# Patient Record
Sex: Female | Born: 1980 | Race: White | Hispanic: No | Marital: Married | Smoking: Never smoker
Health system: Southern US, Community
[De-identification: ages and names within clinical notes are randomized; demographics above are authoritative.]

## PROBLEM LIST (undated history)

## (undated) DIAGNOSIS — F32A Depression, unspecified: Secondary | ICD-10-CM

## (undated) DIAGNOSIS — F419 Anxiety disorder, unspecified: Secondary | ICD-10-CM

## (undated) DIAGNOSIS — F319 Bipolar disorder, unspecified: Secondary | ICD-10-CM

## (undated) DIAGNOSIS — R7303 Prediabetes: Secondary | ICD-10-CM

## (undated) DIAGNOSIS — F329 Major depressive disorder, single episode, unspecified: Secondary | ICD-10-CM

## (undated) DIAGNOSIS — D649 Anemia, unspecified: Secondary | ICD-10-CM

## (undated) DIAGNOSIS — G43909 Migraine, unspecified, not intractable, without status migrainosus: Secondary | ICD-10-CM

## (undated) HISTORY — PX: WISDOM TOOTH EXTRACTION: SHX21

## (undated) HISTORY — PX: GASTRIC BYPASS: SHX52

## (undated) HISTORY — PX: CHOLECYSTECTOMY: SHX55

## (undated) HISTORY — PX: OCCIPITAL NEURECTOMY: SHX2096

## (undated) MED FILL — Iron Sucrose Inj 20 MG/ML (Fe Equiv): INTRAVENOUS | Qty: 15 | Status: AC

---

## 2007-03-24 ENCOUNTER — Ambulatory Visit: Payer: Self-pay | Admitting: Internal Medicine

## 2007-11-29 ENCOUNTER — Ambulatory Visit: Payer: Self-pay | Admitting: Obstetrics and Gynecology

## 2008-01-26 ENCOUNTER — Ambulatory Visit: Payer: Self-pay | Admitting: Obstetrics and Gynecology

## 2008-04-26 ENCOUNTER — Inpatient Hospital Stay: Payer: Self-pay | Admitting: Obstetrics and Gynecology

## 2009-01-30 ENCOUNTER — Ambulatory Visit: Payer: Self-pay | Admitting: Family Medicine

## 2009-05-08 ENCOUNTER — Ambulatory Visit: Payer: Self-pay

## 2010-04-21 ENCOUNTER — Ambulatory Visit: Payer: Self-pay | Admitting: Internal Medicine

## 2010-08-26 ENCOUNTER — Ambulatory Visit: Payer: Self-pay | Admitting: Internal Medicine

## 2011-06-08 ENCOUNTER — Ambulatory Visit: Payer: Self-pay | Admitting: Family Medicine

## 2011-06-27 ENCOUNTER — Ambulatory Visit: Payer: Self-pay | Admitting: Internal Medicine

## 2011-09-14 DIAGNOSIS — M792 Neuralgia and neuritis, unspecified: Secondary | ICD-10-CM | POA: Insufficient documentation

## 2011-09-14 DIAGNOSIS — G43009 Migraine without aura, not intractable, without status migrainosus: Secondary | ICD-10-CM | POA: Insufficient documentation

## 2011-09-29 DIAGNOSIS — F32A Depression, unspecified: Secondary | ICD-10-CM | POA: Insufficient documentation

## 2011-10-12 ENCOUNTER — Ambulatory Visit: Payer: Self-pay

## 2011-10-15 DIAGNOSIS — M5481 Occipital neuralgia: Secondary | ICD-10-CM | POA: Insufficient documentation

## 2011-12-29 ENCOUNTER — Ambulatory Visit: Payer: Self-pay | Admitting: Family Medicine

## 2011-12-29 LAB — URINALYSIS, COMPLETE
Bilirubin,UR: NEGATIVE
Glucose,UR: NEGATIVE mg/dL (ref 0–75)
Ketone: NEGATIVE
Ph: 7 (ref 4.5–8.0)
Protein: NEGATIVE
Specific Gravity: 1.01 (ref 1.003–1.030)

## 2012-01-01 LAB — URINE CULTURE

## 2012-03-03 DIAGNOSIS — R519 Headache, unspecified: Secondary | ICD-10-CM | POA: Insufficient documentation

## 2012-04-18 ENCOUNTER — Ambulatory Visit: Payer: Self-pay

## 2012-04-18 LAB — URINALYSIS, COMPLETE
Bilirubin,UR: NEGATIVE
Ketone: NEGATIVE
Ph: 6 (ref 4.5–8.0)
RBC,UR: >30 /HPF (ref 0–5)
Specific Gravity: >=1.03 (ref 1.003–1.030)

## 2012-04-20 LAB — URINE CULTURE

## 2012-05-01 ENCOUNTER — Emergency Department: Payer: Self-pay | Admitting: Emergency Medicine

## 2012-05-01 LAB — COMPREHENSIVE METABOLIC PANEL
Albumin: 3.8 g/dL (ref 3.4–5.0)
Alkaline Phosphatase: 100 U/L (ref 50–136)
Calcium, Total: 9.4 mg/dL (ref 8.5–10.1)
Co2: 20 mmol/L — ABNORMAL LOW (ref 21–32)
EGFR (Non-African Amer.): >60
Glucose: 133 mg/dL — ABNORMAL HIGH (ref 65–99)
Osmolality: 277 (ref 275–301)
SGOT(AST): 18 U/L (ref 15–37)
SGPT (ALT): 20 U/L (ref 12–78)
Sodium: 138 mmol/L (ref 136–145)
Total Protein: 7.7 g/dL (ref 6.4–8.2)

## 2012-05-01 LAB — CBC
HCT: 38.7 % (ref 35.0–47.0)
HGB: 12.6 g/dL (ref 12.0–16.0)
RBC: 4.62 10*6/uL (ref 3.80–5.20)
WBC: 6.8 10*3/uL (ref 3.6–11.0)

## 2012-05-03 ENCOUNTER — Ambulatory Visit: Payer: Self-pay

## 2012-05-03 LAB — LITHIUM LEVEL: Lithium: 0.58 mmol/L — ABNORMAL LOW

## 2012-05-10 ENCOUNTER — Ambulatory Visit: Payer: Self-pay

## 2012-05-10 LAB — LITHIUM LEVEL: Lithium: 0.91 mmol/L

## 2012-05-13 ENCOUNTER — Ambulatory Visit: Payer: Self-pay | Admitting: Internal Medicine

## 2012-05-13 LAB — URINALYSIS, COMPLETE
Blood: NEGATIVE
Nitrite: NEGATIVE
Ph: 5 (ref 4.5–8.0)
Protein: NEGATIVE
Specific Gravity: 1.025 (ref 1.003–1.030)

## 2012-05-13 LAB — PREGNANCY, URINE: Pregnancy Test, Urine: NEGATIVE m[IU]/mL

## 2012-05-14 LAB — URINE CULTURE

## 2012-08-22 DIAGNOSIS — G43909 Migraine, unspecified, not intractable, without status migrainosus: Secondary | ICD-10-CM | POA: Insufficient documentation

## 2012-11-17 ENCOUNTER — Ambulatory Visit: Payer: Self-pay

## 2012-11-17 LAB — LITHIUM LEVEL: Lithium: 0.93 mmol/L

## 2012-11-23 ENCOUNTER — Emergency Department: Payer: Self-pay | Admitting: Emergency Medicine

## 2012-11-23 LAB — ACETAMINOPHEN LEVEL: Acetaminophen: 3 ug/mL — ABNORMAL LOW

## 2012-11-23 LAB — COMPREHENSIVE METABOLIC PANEL
Albumin: 3.3 g/dL — ABNORMAL LOW (ref 3.4–5.0)
Alkaline Phosphatase: 127 U/L (ref 50–136)
Anion Gap: 5 — ABNORMAL LOW (ref 7–16)
BUN: 9 mg/dL (ref 7–18)
Co2: 27 mmol/L (ref 21–32)
Creatinine: 0.63 mg/dL (ref 0.60–1.30)
EGFR (African American): >60
Glucose: 88 mg/dL (ref 65–99)
Osmolality: 285 (ref 275–301)
Potassium: 3.6 mmol/L (ref 3.5–5.1)
SGOT(AST): 93 U/L — ABNORMAL HIGH (ref 15–37)
SGPT (ALT): 133 U/L — ABNORMAL HIGH (ref 12–78)
Sodium: 144 mmol/L (ref 136–145)

## 2012-11-23 LAB — CBC
HGB: 11 g/dL — ABNORMAL LOW (ref 12.0–16.0)
MCH: 25.7 pg — ABNORMAL LOW (ref 26.0–34.0)
MCHC: 32.6 g/dL (ref 32.0–36.0)
MCV: 79 fL — ABNORMAL LOW (ref 80–100)
Platelet: 378 10*3/uL (ref 150–440)
RBC: 4.27 10*6/uL (ref 3.80–5.20)
RDW: 17.2 % — ABNORMAL HIGH (ref 11.5–14.5)
WBC: 8.1 10*3/uL (ref 3.6–11.0)

## 2012-11-23 LAB — DRUG SCREEN, URINE
Amphetamines, Ur Screen: NEGATIVE (ref ?–1000)
Barbiturates, Ur Screen: NEGATIVE (ref ?–200)
Benzodiazepine, Ur Scrn: NEGATIVE (ref ?–200)
Cannabinoid 50 Ng, Ur ~~LOC~~: NEGATIVE (ref ?–50)
MDMA (Ecstasy)Ur Screen: NEGATIVE (ref ?–500)
Methadone, Ur Screen: NEGATIVE (ref ?–300)
Opiate, Ur Screen: POSITIVE (ref ?–300)
Phencyclidine (PCP) Ur S: NEGATIVE (ref ?–25)
Tricyclic, Ur Screen: NEGATIVE (ref ?–1000)

## 2012-11-23 LAB — ETHANOL: Ethanol: <3 mg/dL

## 2012-11-23 LAB — TSH: Thyroid Stimulating Horm: 3.06 u[IU]/mL

## 2013-01-23 ENCOUNTER — Ambulatory Visit: Payer: Self-pay

## 2013-01-23 LAB — BASIC METABOLIC PANEL
BUN: 8 mg/dL (ref 7–18)
Calcium, Total: 8.9 mg/dL (ref 8.5–10.1)
EGFR (African American): >60
EGFR (Non-African Amer.): >60
Glucose: 91 mg/dL (ref 65–99)
Sodium: 140 mmol/L (ref 136–145)

## 2013-01-23 LAB — LITHIUM LEVEL: Lithium: 0.82 mmol/L

## 2013-03-28 ENCOUNTER — Encounter (HOSPITAL_COMMUNITY): Payer: Self-pay | Admitting: Emergency Medicine

## 2013-03-28 ENCOUNTER — Emergency Department (HOSPITAL_COMMUNITY)
Admission: EM | Admit: 2013-03-28 | Discharge: 2013-03-29 | Disposition: A | Discharge status: Other Acute Inpatient Hospital | Payer: 59 | Attending: Emergency Medicine | Admitting: Emergency Medicine

## 2013-03-28 DIAGNOSIS — F3289 Other specified depressive episodes: Secondary | ICD-10-CM | POA: Insufficient documentation

## 2013-03-28 DIAGNOSIS — Z79899 Other long term (current) drug therapy: Secondary | ICD-10-CM | POA: Insufficient documentation

## 2013-03-28 DIAGNOSIS — G43909 Migraine, unspecified, not intractable, without status migrainosus: Secondary | ICD-10-CM | POA: Insufficient documentation

## 2013-03-28 DIAGNOSIS — F419 Anxiety disorder, unspecified: Secondary | ICD-10-CM

## 2013-03-28 DIAGNOSIS — Z3202 Encounter for pregnancy test, result negative: Secondary | ICD-10-CM | POA: Insufficient documentation

## 2013-03-28 DIAGNOSIS — F411 Generalized anxiety disorder: Secondary | ICD-10-CM | POA: Insufficient documentation

## 2013-03-28 DIAGNOSIS — F329 Major depressive disorder, single episode, unspecified: Secondary | ICD-10-CM

## 2013-03-28 HISTORY — DX: Major depressive disorder, single episode, unspecified: F32.9

## 2013-03-28 HISTORY — DX: Depression, unspecified: F32.A

## 2013-03-28 HISTORY — DX: Anxiety disorder, unspecified: F41.9

## 2013-03-28 HISTORY — DX: Migraine, unspecified, not intractable, without status migrainosus: G43.909

## 2013-03-28 LAB — COMPREHENSIVE METABOLIC PANEL
ALT: 22 U/L (ref 0–35)
BUN: 9 mg/dL (ref 6–23)
CO2: 15 mEq/L — ABNORMAL LOW (ref 19–32)
Calcium: 9.7 mg/dL (ref 8.4–10.5)
Chloride: 99 mEq/L (ref 96–112)
Creatinine, Ser: 0.69 mg/dL (ref 0.50–1.10)
GFR calc Af Amer: >90 mL/min (ref >90)
GFR calc non Af Amer: >90 mL/min (ref >90)
Glucose, Bld: 107 mg/dL — ABNORMAL HIGH (ref 70–99)
Total Bilirubin: 0.3 mg/dL (ref 0.3–1.2)
Total Protein: 7.5 g/dL (ref 6.0–8.3)

## 2013-03-28 LAB — CBC
HCT: 41 % (ref 36.0–46.0)
MCH: 26.1 pg (ref 26.0–34.0)
MCHC: 32.4 g/dL (ref 30.0–36.0)
MCV: 80.4 fL (ref 78.0–100.0)
RBC: 5.1 MIL/uL (ref 3.87–5.11)
RDW: 16.4 % — ABNORMAL HIGH (ref 11.5–15.5)
WBC: 14.2 10*3/uL — ABNORMAL HIGH (ref 4.0–10.5)

## 2013-03-28 LAB — RAPID URINE DRUG SCREEN, HOSP PERFORMED
Amphetamines: NOT DETECTED
Opiates: NOT DETECTED

## 2013-03-28 LAB — POCT PREGNANCY, URINE: Preg Test, Ur: NEGATIVE

## 2013-03-28 LAB — LITHIUM LEVEL: Lithium Lvl: 0.25 mEq/L — ABNORMAL LOW (ref 0.80–1.40)

## 2013-03-28 LAB — ETHANOL: Alcohol, Ethyl (B): <11 mg/dL (ref 0–11)

## 2013-03-28 LAB — SALICYLATE LEVEL: Salicylate Lvl: 2.7 mg/dL — ABNORMAL LOW (ref 2.8–20.0)

## 2013-03-28 MED ORDER — ONDANSETRON HCL 4 MG PO TABS: 4.0000 mg | ORAL_TABLET | Freq: Three times a day (TID) | ORAL | Status: DC | PRN

## 2013-03-28 MED ORDER — IBUPROFEN 600 MG PO TABS
600.0000 mg | ORAL_TABLET | Freq: Three times a day (TID) | ORAL | Status: DC | PRN
  Administered 2013-03-28 – 2013-03-29 (×2): 600 mg via ORAL
  Filled 2013-03-28 (×2): qty 3

## 2013-03-28 MED ORDER — LORAZEPAM 1 MG PO TABS
1.0000 mg | ORAL_TABLET | Freq: Three times a day (TID) | ORAL | Status: DC | PRN
  Administered 2013-03-28 – 2013-03-29 (×2): 1 mg via ORAL
  Filled 2013-03-28 (×2): qty 1

## 2013-03-28 MED ORDER — ACETAMINOPHEN 325 MG PO TABS
650.0000 mg | ORAL_TABLET | ORAL | Status: DC | PRN
  Administered 2013-03-29 (×2): 650 mg via ORAL
  Filled 2013-03-28 (×2): qty 2

## 2013-03-28 MED ORDER — ALUM & MAG HYDROXIDE-SIMETH 200-200-20 MG/5ML PO SUSP
30.0000 mL | ORAL | Status: DC | PRN
  Filled 2013-03-28: qty 30

## 2013-03-28 MED ORDER — ZOLPIDEM TARTRATE 5 MG PO TABS
5.0000 mg | ORAL_TABLET | Freq: Every evening | ORAL | Status: DC | PRN
  Administered 2013-03-28: 5 mg via ORAL
  Filled 2013-03-28: qty 1

## 2013-03-28 NOTE — ED Notes (Signed)
Patient arrived to unit; no s/s of distress noted. Pt with dull, flat affect endorsing depression. Pt states she is not currently having thoughts of SI and verbally contracts for safety. Pt states she has been on Vicodin for a year and a half and is trying wean herself off. 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

## 2013-03-28 NOTE — ED Provider Notes (Signed)
CSN: [id number]     Arrival date & time 03/28/13  1813 History   First MD Initiated Contact with Patient 03/28/13 1828     Chief Complaint  Patient presents with  . Medical Clearance   (Consider location/radiation/quality/duration/timing/severity/associated sxs/prior Treatment) HPI Patient presents to the emergency department with complaints of increased depression and suicidal ideation.  The patient denies hallucinations, chest pain, shortness of breath, nausea, vomiting, weakness, numbness, or dizziness.  Patient, states she's not been on her medications except the last 4 days.  Patient was sent here by her psychiatrist Past Medical History  Diagnosis Date  . Depression   . Migraine   . Anxiety    Past Surgical History  Procedure Laterality Date  . Gastric bypass    . Cholecystectomy    . Cesarean section     No family history on file. History  Substance Use Topics  . Smoking status: Never Smoker   . Smokeless tobacco: Not on file  . Alcohol Use: No   OB History   Grav Para Term Preterm Abortions TAB SAB Ect Mult Living                 Review of Systems All other systems negative except as documented in the HPI. All pertinent positives and negatives as reviewed in the HPI. Allergies  Review of patient's allergies indicates no known allergies.  Home Medications   Current Outpatient Rx  Name  Route  Sig  Dispense  Refill  . eletriptan (RELPAX) 20 MG tablet   Oral   Take 20 mg by mouth as needed for migraine. One tablet by mouth at onset of headache. May repeat in 2 hours if headache persists or recurs.         Marland Kitchen HYDROcodone-acetaminophen (NORCO) 10-325 MG per tablet   Oral   Take 1 tablet by mouth every 6 (six) hours as needed for pain.         Marland Kitchen ibuprofen (ADVIL,MOTRIN) 200 MG tablet   Oral   Take 800 mg by mouth every 8 (eight) hours as needed for pain.         Marland Kitchen lithium carbonate 300 MG capsule   Oral   Take 900 mg by mouth at bedtime.         .  pregabalin (LYRICA) 150 MG capsule   Oral   Take 150-300 mg by mouth 2 (two) times daily. 1 cap in am, 2 caps in pm         . topiramate (TOPAMAX) 100 MG tablet   Oral   Take 200 mg by mouth at bedtime.         Marland Kitchen venlafaxine XR (EFFEXOR-XR) 150 MG 24 hr capsule   Oral   Take 300 mg by mouth daily.          BP 121/81  Pulse 115  Temp(Src) 99 F (37.2 C) (Oral)  Resp 18  SpO2 100% Physical Exam  Nursing note and vitals reviewed. Constitutional: She is oriented to person, place, and time. She appears well-developed and well-nourished. No distress.  HENT:  Head: Normocephalic and atraumatic.  Eyes: Pupils are equal, round, and reactive to light.  Neck: Normal range of motion. Neck supple.  Cardiovascular: Normal rate, regular rhythm and normal heart sounds.  Exam reveals no gallop and no friction rub.   No murmur heard. Pulmonary/Chest: Effort normal and breath sounds normal.  Neurological: She is alert and oriented to person, place, and time. She exhibits normal muscle tone.  Coordination normal.  Skin: Skin is warm and dry. No rash noted. No erythema.    ED Course  Procedures (including critical care time) Labs Review Labs Reviewed  ACETAMINOPHEN LEVEL  CBC  COMPREHENSIVE METABOLIC PANEL  ETHANOL  SALICYLATE LEVEL  URINE RAPID DRUG SCREEN (HOSP PERFORMED)   patient will be seen and evaluated by the ACT Team for placement of a prior health.  Patient is stable at this time MDM      Carlyle Dolly, PA-C 03/28/13 1958

## 2013-03-28 NOTE — ED Notes (Signed)
Pt seen psychiatrist and was told to come to [place] to get a bed at [facility]. Pt stopped taking her medication for a while then just recently started taking them 2 days ago and has now been very depressed. Is having SI thoughts but no attempts or plan.

## 2013-03-29 ENCOUNTER — Encounter (HOSPITAL_COMMUNITY): Payer: Self-pay | Admitting: Registered Nurse

## 2013-03-29 ENCOUNTER — Inpatient Hospital Stay (HOSPITAL_COMMUNITY)
Admission: AD | Admit: 2013-03-29 | Discharge: 2013-04-03 | DRG: 885 | Disposition: A | Discharge status: Home / Self Care | Payer: 59 | Source: Intra-hospital | Attending: Psychiatry | Admitting: Psychiatry

## 2013-03-29 ENCOUNTER — Encounter (HOSPITAL_COMMUNITY): Payer: Self-pay

## 2013-03-29 DIAGNOSIS — Z79899 Other long term (current) drug therapy: Secondary | ICD-10-CM

## 2013-03-29 DIAGNOSIS — R45851 Suicidal ideations: Secondary | ICD-10-CM

## 2013-03-29 DIAGNOSIS — F411 Generalized anxiety disorder: Secondary | ICD-10-CM

## 2013-03-29 DIAGNOSIS — F3289 Other specified depressive episodes: Secondary | ICD-10-CM

## 2013-03-29 DIAGNOSIS — F1193 Opioid use, unspecified with withdrawal: Secondary | ICD-10-CM

## 2013-03-29 DIAGNOSIS — F319 Bipolar disorder, unspecified: Secondary | ICD-10-CM | POA: Diagnosis present

## 2013-03-29 DIAGNOSIS — F314 Bipolar disorder, current episode depressed, severe, without psychotic features: Principal | ICD-10-CM | POA: Diagnosis present

## 2013-03-29 DIAGNOSIS — F329 Major depressive disorder, single episode, unspecified: Secondary | ICD-10-CM

## 2013-03-29 MED ORDER — INFLUENZA VAC SPLIT QUAD 0.5 ML IM SUSP
0.5000 mL | INTRAMUSCULAR | Status: AC
  Administered 2013-03-30: 0.5 mL via INTRAMUSCULAR
  Filled 2013-03-29: qty 0.5

## 2013-03-29 MED ORDER — TRAZODONE HCL 50 MG PO TABS
50.0000 mg | ORAL_TABLET | Freq: Every evening | ORAL | Status: DC | PRN
  Administered 2013-03-29 – 2013-03-31 (×3): 50 mg via ORAL
  Filled 2013-03-29 (×3): qty 1
  Filled 2013-03-29: qty 28

## 2013-03-29 MED ORDER — PREGABALIN 75 MG PO CAPS
75.0000 mg | ORAL_CAPSULE | Freq: Two times a day (BID) | ORAL | Status: DC
  Administered 2013-03-29: 75 mg via ORAL
  Filled 2013-03-29 (×2): qty 1

## 2013-03-29 MED ORDER — LITHIUM CARBONATE 300 MG PO CAPS
300.0000 mg | ORAL_CAPSULE | Freq: Two times a day (BID) | ORAL | Status: DC
  Administered 2013-03-29: 300 mg via ORAL
  Filled 2013-03-29 (×2): qty 1

## 2013-03-29 MED ORDER — ACETAMINOPHEN 325 MG PO TABS
650.0000 mg | ORAL_TABLET | Freq: Four times a day (QID) | ORAL | Status: DC | PRN
  Administered 2013-03-29 – 2013-03-30 (×2): 650 mg via ORAL

## 2013-03-29 MED ORDER — VENLAFAXINE HCL ER 150 MG PO CP24
150.0000 mg | ORAL_CAPSULE | Freq: Every day | ORAL | Status: DC
  Filled 2013-03-29: qty 1

## 2013-03-29 MED ORDER — ALUM & MAG HYDROXIDE-SIMETH 200-200-20 MG/5ML PO SUSP: 30.0000 mL | ORAL | Status: DC | PRN

## 2013-03-29 MED ORDER — TOPIRAMATE 100 MG PO TABS
100.0000 mg | ORAL_TABLET | Freq: Every day | ORAL | Status: DC
  Filled 2013-03-29: qty 1

## 2013-03-29 MED ORDER — MAGNESIUM HYDROXIDE 400 MG/5ML PO SUSP: 30.0000 mL | Freq: Every day | ORAL | Status: DC | PRN

## 2013-03-29 NOTE — Tx Team (Signed)
Initial Interdisciplinary Treatment Plan  PATIENT STRENGTHS: (choose at least two) Ability for insight Average or above average intelligence Capable of independent living Communication skills Motivation for treatment/growth Supportive family/friends  PATIENT STRESSORS: Financial difficulties Health problems Medication change or noncompliance   PROBLEM LIST: Problem List/Patient Goals Date to be addressed Date deferred Reason deferred Estimated date of resolution  depression 03/29/2013     anxiety 03/29/2013     Suicidal ideation 03/29/2013                                          DISCHARGE CRITERIA:  Ability to meet basic life and health needs Improved stabilization in mood, thinking, and/or behavior Medical problems require only outpatient monitoring Motivation to continue treatment in a less acute level of care Need for constant or close observation no longer present Verbal commitment to aftercare and medication compliance  PRELIMINARY DISCHARGE PLAN: Attend aftercare/continuing care group Attend PHP/IOP Outpatient therapy Return to previous living arrangement  PATIENT/FAMIILY INVOLVEMENT: This treatment plan has been presented to and reviewed with the patient, Carla Gentry, and/or family member,  The patient and family have been given the opportunity to ask questions and make suggestions.  JEHU-APPIAH, Zachariah Pavek K 03/29/2013, 10:50 PM

## 2013-03-29 NOTE — ED Notes (Addendum)
Patient c/o occupital headache 6/10./medicated. Husband in to visit.

## 2013-03-29 NOTE — Progress Notes (Signed)
Patient ID: Carla Gentry, female   DOB: 22-Oct-1980, 32 y.o.   MRN: [id number] Admission note: D:Patient is a  Voluntary admission in no acute distress for depression, anxiety, and suicidal ideation. Pt stated she had surgery 3 weeks ago for excision of a nerve at the back of her head. Pt  stated she has not taken her medication for 2 weeks because she is unable to afford them. Pt reports increased depression and suicidal ideation from unable to work and take care of her daughter. Pt stated "felt scared to be home alone because of suicidal thoughts". Pt also stated losing her mother last December contributed to the depression because she was her support person. Pt denies SI/HI/AVH. Pt c/o headache rated 6 out of 0-10 scale. Pt c/o of soreness at incision site, no redness or discharge.  A: Pt admitted to unit per protocol, skin assessment and belonging search done. No skin issues noted. Consent signed by pt. Pt educated on therapeutic milieu rules. Pt was introduced to milieu by nursing staff. Fall risk safety plan explained to the patient. 15 minutes checks started for safety.  R: Pt was receptive to education. Writer offered support.

## 2013-03-29 NOTE — ED Provider Notes (Addendum)
  Medical screening examination/treatment/procedure(s) were performed by non-physician practitioner and as supervising physician I was immediately available for consultation/collaboration.    Gerhard Munch, MD 03/29/13 0006  Patient accepted to [facility].  Dr. Lynnae Sandhoff, MD 03/29/13 1811  7:10 PM With a mild leukocytosis, I examined the patient's head.  She is in no distress, neurologically intact.  Patient has 2 areas of prior surgical repair, both of which have minor scabbing.  Finish no confluent or spreading erythema, no drainage, no bleeding.  This would appear appropriate for recent procedure.  Patient was counseled to make the staff aware of any notable changes in her condition, but is appropriate for transfer to behavioral health.  Gerhard Munch, MD 03/29/13 1911

## 2013-03-29 NOTE — ED Notes (Signed)
Patient temp is 99.1, WBC 14. Spoke w/EDP Jeraldine Loots informed him of temp, no redness, no swelling and no pain but soreness at occipital incision site. MD stated she is okay to transfer to [facility]. If any further issues, she can return to ED forfurther treatment.

## 2013-03-29 NOTE — ED Notes (Signed)
Patient crying b/c she isn't being discharged, anxious/medicated.

## 2013-03-29 NOTE — ED Notes (Signed)
Transport services called to take pt to [facility] for admission. Pt's husband visiting in room at this time. No distress noted.

## 2013-03-29 NOTE — Consult Note (Addendum)
[facility] Face-to-Face Psychiatry Consult   Reason for Consult:  32 year old female presented to [facility] SI and increased depression.     Referring Physician:  [name] is an 32 y.o. female.  Assessment: AXIS I:  Depressive Disorder NOS and Anxiety AXIS II:  Deferred AXIS III:   Past Medical History  Diagnosis Date  . Depression   . Migraine   . Anxiety    AXIS IV:  economic problems AXIS V:  51-60 moderate symptoms  Plan:  Recommened assessment 03/29/13 by Psychiatry  Subjective:   Carla Gentry is a 32 y.o. female patient who presented to [facility] with SI and worsening depression.  She attributes SI and worsening depression to not taking her medications for 2 weeks.  SI has been present x 2 weeks, intermittent, worse over the "last few days".  She reports that she stopped taking her medications as she could not afford them as they were too expensive and she did not have insurance.  She explains that she did not feel safe by herself and as a result she presented to [facility].  Patient resides in the home with her husband, however he is working and will not be at home tonight.  She reports that she had a plan to OD on pills.  Her insurance will take affect 03/29/13 and she "should"" be able to purchase her medications.  She reports taking her medications regularly.  She expresses concern re: amount of time that it will take for medications to take effect once restarted.  Patient denies SI, HI, or AVH.  She reports that she has not had SI since she has been at [employer], contracts for safety.  She is presently under the care of Chris at [facility].        Past Psychiatric History:  Past Medical History  Diagnosis Date  . Depression   . Migraine   . Anxiety     reports that she has never smoked. She does not have any smokeless tobacco history on file. She reports that she does not drink alcohol. Her drug history is not on file. No family history on file.         Allergies:  No Known  Allergies  Objective: Blood pressure 105/73, pulse 108, temperature 98.6 F (37 C), temperature source Oral, resp. rate 17, SpO2 98.00%.There is no height or weight on file to calculate BMI. Results for orders placed during the hospital encounter of 03/28/13 (from the past 72 hour(s))  ACETAMINOPHEN LEVEL     Status: None   Collection Time    03/28/13  7:31 PM      Result Value Range   Acetaminophen (Tylenol), Serum <15.0  10 - 30 ug/mL   Comment:            THERAPEUTIC CONCENTRATIONS VARY     SIGNIFICANTLY. A RANGE OF 10-30     ug/mL MAY BE AN EFFECTIVE     CONCENTRATION FOR MANY PATIENTS.     HOWEVER, SOME ARE BEST TREATED     AT CONCENTRATIONS OUTSIDE THIS     RANGE.     ACETAMINOPHEN CONCENTRATIONS     >150 ug/mL AT 4 HOURS AFTER     INGESTION AND >50 ug/mL AT 12     HOURS AFTER INGESTION ARE     OFTEN ASSOCIATED WITH TOXIC     REACTIONS.  CBC     Status: Abnormal   Collection Time    03/28/13  7:31 PM  Result Value Range   WBC 14.2 (*) 4.0 - 10.5 K/uL   RBC 5.10  3.87 - 5.11 MIL/uL   Hemoglobin 13.3  12.0 - 15.0 g/dL   HCT 40.9  81.1 - 91.4 %   MCV 80.4  78.0 - 100.0 fL   MCH 26.1  26.0 - 34.0 pg   MCHC 32.4  30.0 - 36.0 g/dL   RDW 78.2 (*) 95.6 - 21.3 %   Platelets 441 (*) 150 - 400 K/uL  COMPREHENSIVE METABOLIC PANEL     Status: Abnormal   Collection Time    03/28/13  7:31 PM      Result Value Range   Sodium 132 (*) 135 - 145 mEq/L   Potassium 3.4 (*) 3.5 - 5.1 mEq/L   Chloride 99  96 - 112 mEq/L   CO2 15 (*) 19 - 32 mEq/L   Glucose, Bld 107 (*) 70 - 99 mg/dL   BUN 9  6 - 23 mg/dL   Creatinine, Ser 0.86  0.50 - 1.10 mg/dL   Calcium 9.7  8.4 - 57.8 mg/dL   Total Protein 7.5  6.0 - 8.3 g/dL   Albumin 3.8  3.5 - 5.2 g/dL   AST 21  0 - 37 U/L   ALT 22  0 - 35 U/L   Alkaline Phosphatase 136 (*) 39 - 117 U/L   Total Bilirubin 0.3  0.3 - 1.2 mg/dL   GFR calc non Af Amer >90  >90 mL/min   GFR calc Af Amer >90  >90 mL/min   Comment: (NOTE)     The eGFR has  been calculated using the CKD EPI equation.     This calculation has not been validated in all clinical situations.     eGFR's persistently <90 mL/min signify possible Chronic Kidney     Disease.  ETHANOL     Status: None   Collection Time    03/28/13  7:31 PM      Result Value Range   Alcohol, Ethyl (B) <11  0 - 11 mg/dL   Comment:            LOWEST DETECTABLE LIMIT FOR     SERUM ALCOHOL IS 11 mg/dL     FOR MEDICAL PURPOSES ONLY  SALICYLATE LEVEL     Status: Abnormal   Collection Time    03/28/13  7:31 PM      Result Value Range   Salicylate Lvl 2.7 (*) 2.8 - 20.0 mg/dL  LITHIUM LEVEL     Status: Abnormal   Collection Time    03/28/13  8:05 PM      Result Value Range   Lithium Lvl 0.25 (*) 0.80 - 1.40 mEq/L  URINE RAPID DRUG SCREEN (HOSP PERFORMED)     Status: Abnormal   Collection Time    03/28/13  8:45 PM      Result Value Range   Opiates NONE DETECTED  NONE DETECTED   Cocaine NONE DETECTED  NONE DETECTED   Benzodiazepines POSITIVE (*) NONE DETECTED   Amphetamines NONE DETECTED  NONE DETECTED   Tetrahydrocannabinol NONE DETECTED  NONE DETECTED   Barbiturates NONE DETECTED  NONE DETECTED   Comment:            DRUG SCREEN FOR MEDICAL PURPOSES     ONLY.  IF CONFIRMATION IS NEEDED     FOR ANY PURPOSE, NOTIFY LAB     WITHIN 5 DAYS.  LOWEST DETECTABLE LIMITS     FOR URINE DRUG SCREEN     Drug Class       Cutoff (ng/mL)     Amphetamine      1000     Barbiturate      200     Benzodiazepine   200     Tricyclics       300     Opiates          300     Cocaine          300     THC              50  POCT PREGNANCY, URINE     Status: None   Collection Time    03/28/13  9:04 PM      Result Value Range   Preg Test, Ur NEGATIVE  NEGATIVE   Comment:            THE SENSITIVITY OF THIS     METHODOLOGY IS >24 mIU/mL   Labs reviewed, stable.    Current Facility-Administered Medications  Medication Dose Route Frequency Provider Last Rate Last Dose  .  acetaminophen (TYLENOL) tablet 650 mg  650 mg Oral Q4H PRN Jamesetta Orleans Lawyer, PA-C      . alum & mag hydroxide-simeth (MAALOX/MYLANTA) 200-200-20 MG/5ML suspension 30 mL  30 mL Oral PRN Jamesetta Orleans Lawyer, PA-C      . ibuprofen (ADVIL,MOTRIN) tablet 600 mg  600 mg Oral Q8H PRN Jamesetta Orleans Lawyer, PA-C   600 mg at 03/28/13 2102  . LORazepam (ATIVAN) tablet 1 mg  1 mg Oral Q8H PRN Jamesetta Orleans Lawyer, PA-C   1 mg at 03/28/13 2102  . ondansetron (ZOFRAN) tablet 4 mg  4 mg Oral Q8H PRN Jamesetta Orleans Lawyer, PA-C      . zolpidem [facility]) tablet 5 mg  5 mg Oral QHS PRN Carlyle Dolly, PA-C   5 mg at 03/28/13 2102   Current Outpatient Prescriptions  Medication Sig Dispense Refill  . eletriptan (RELPAX) 20 MG tablet Take 20 mg by mouth as needed for migraine. One tablet by mouth at onset of headache. May repeat in 2 hours if headache persists or recurs.      Marland Kitchen HYDROcodone-acetaminophen (NORCO) 10-325 MG per tablet Take 1 tablet by mouth every 6 (six) hours as needed for pain.      Marland Kitchen ibuprofen (ADVIL,MOTRIN) 200 MG tablet Take 800 mg by mouth every 8 (eight) hours as needed for pain.      Marland Kitchen lithium carbonate 300 MG capsule Take 900 mg by mouth at bedtime.      . pregabalin (LYRICA) 150 MG capsule Take 150-300 mg by mouth 2 (two) times daily. 1 cap in am, 2 caps in pm      . topiramate (TOPAMAX) 100 MG tablet Take 200 mg by mouth at bedtime.      Marland Kitchen venlafaxine XR (EFFEXOR-XR) 150 MG 24 hr capsule Take 300 mg by mouth daily.        Psychiatric Specialty Exam:     Blood pressure 105/73, pulse 108, temperature 98.6 F (37 C), temperature source Oral, resp. rate 17, SpO2 98.00%.There is no height or weight on file to calculate BMI.  General Appearance: Well groomed  Patent attorney::  Fair  Speech:  Clear and Coherent and Normal Rate  Volume:  Normal  Mood:  Depressed  Affect:  Depressed  Thought Process:  Intact  Orientation:  Full (Time,  Place, and Person)  Thought Content:  Negative   Suicidal Thoughts:  Yes.  with intent/plan  Homicidal Thoughts:  No  Memory:  Immediate;   Good  Judgement:  Fair  Insight:  Good  Psychomotor Activity:  Negative  Concentration:  Good  Recall:  Good  Akathisia:  Negative  Handed:    AIMS (if indicated):     Assets:  Communication Skills Desire for Improvement Housing Social Support  Sleep:      Treatment Plan Summary: 1) Meeting inpatient criteria for crisis management, safety, and stabilization of depression, anxiety, and SI pending bed [address] hall [facility] 2) SW to aid or facilitate in outpatient support services and Psychiatric management  3) Management of applicable co-morbidities  4) Administration of anti-depressants, anti-anxiety medications, and/or psychotherapeutic interventions   Kizzie Fantasia CORI 03/29/2013 12:54 AM  I agreed with the findings, treatment and disposition plan of this patient. Kathryne Sharper, MD  03/29/2013  Face to face interview and consult with Dr. Lolly Mustache  Patient states that she is feeling better.  Will restart patient medication.  Patient states that her insurance goes in affect 03/29/2013 and that she will be able to get her medications.  Informed that we will start her medications today and admit to inpatient treatment to make sure medications is working and safety, stabilization.    Agree with previous assessment/disposition for inpatient treatment.    Shuvon B. Rankin FNP-BC Family Nurse Practitioner, Board Certified  I have personally seen the patient and agreed with the findings and involved in the treatment plan. Kathryne Sharper, MD

## 2013-03-29 NOTE — BH Assessment (Signed)
[facility] Assessment Progress Note      Pt accepted to [facility].  Notified Dr Jeraldine Loots.  Pt signed support paperwork. Left message with [facility] to notify of admission.

## 2013-03-30 DIAGNOSIS — F1120 Opioid dependence, uncomplicated: Secondary | ICD-10-CM

## 2013-03-30 DIAGNOSIS — F319 Bipolar disorder, unspecified: Secondary | ICD-10-CM | POA: Diagnosis present

## 2013-03-30 DIAGNOSIS — F314 Bipolar disorder, current episode depressed, severe, without psychotic features: Principal | ICD-10-CM | POA: Diagnosis present

## 2013-03-30 DIAGNOSIS — F3130 Bipolar disorder, current episode depressed, mild or moderate severity, unspecified: Secondary | ICD-10-CM

## 2013-03-30 DIAGNOSIS — R45851 Suicidal ideations: Secondary | ICD-10-CM

## 2013-03-30 DIAGNOSIS — F1193 Opioid use, unspecified with withdrawal: Secondary | ICD-10-CM | POA: Diagnosis present

## 2013-03-30 MED ORDER — VENLAFAXINE HCL ER 150 MG PO CP24
150.0000 mg | ORAL_CAPSULE | Freq: Every day | ORAL | Status: DC
  Administered 2013-03-31 – 2013-04-03 (×4): 150 mg via ORAL
  Filled 2013-03-30 (×5): qty 1
  Filled 2013-03-30: qty 14

## 2013-03-30 MED ORDER — HYDROXYZINE HCL 25 MG PO TABS
25.0000 mg | ORAL_TABLET | Freq: Four times a day (QID) | ORAL | Status: DC | PRN
  Administered 2013-04-01 – 2013-04-02 (×2): 25 mg via ORAL
  Filled 2013-03-30 (×2): qty 1

## 2013-03-30 MED ORDER — NAPROXEN 500 MG PO TABS: 500.0000 mg | ORAL_TABLET | Freq: Two times a day (BID) | ORAL | Status: DC | PRN

## 2013-03-30 MED ORDER — IBUPROFEN 200 MG PO TABS: 400.0000 mg | ORAL_TABLET | Freq: Four times a day (QID) | ORAL | Status: DC | PRN

## 2013-03-30 MED ORDER — METHOCARBAMOL 500 MG PO TABS
500.0000 mg | ORAL_TABLET | Freq: Three times a day (TID) | ORAL | Status: DC | PRN
  Administered 2013-04-02: 500 mg via ORAL
  Filled 2013-03-30: qty 1

## 2013-03-30 MED ORDER — LOPERAMIDE HCL 2 MG PO CAPS
2.0000 mg | ORAL_CAPSULE | ORAL | Status: DC | PRN
  Administered 2013-03-30 – 2013-04-03 (×3): 4 mg via ORAL
  Filled 2013-03-30 (×3): qty 2

## 2013-03-30 MED ORDER — LITHIUM CARBONATE ER 450 MG PO TBCR
900.0000 mg | EXTENDED_RELEASE_TABLET | Freq: Every day | ORAL | Status: DC
  Administered 2013-03-30 – 2013-04-02 (×4): 900 mg via ORAL
  Filled 2013-03-30 (×2): qty 2
  Filled 2013-03-30: qty 28
  Filled 2013-03-30 (×3): qty 2

## 2013-03-30 MED ORDER — PREGABALIN 100 MG PO CAPS: 100.0000 mg | ORAL_CAPSULE | Freq: Two times a day (BID) | ORAL | Status: DC | PRN

## 2013-03-30 MED ORDER — DICYCLOMINE HCL 20 MG PO TABS: 20.0000 mg | ORAL_TABLET | Freq: Four times a day (QID) | ORAL | Status: DC | PRN

## 2013-03-30 MED ORDER — ONDANSETRON 4 MG PO TBDP: 4.0000 mg | ORAL_TABLET | Freq: Four times a day (QID) | ORAL | Status: DC | PRN

## 2013-03-30 MED ORDER — ADULT MULTIVITAMIN W/MINERALS CH
1.0000 | ORAL_TABLET | Freq: Every day | ORAL | Status: DC
  Filled 2013-03-30 (×8): qty 1

## 2013-03-30 MED ORDER — ELETRIPTAN HYDROBROMIDE 20 MG PO TABS: 20.0000 mg | ORAL_TABLET | ORAL | Status: DC | PRN

## 2013-03-30 MED ORDER — TOPIRAMATE 100 MG PO TABS
200.0000 mg | ORAL_TABLET | Freq: Every day | ORAL | Status: DC
  Administered 2013-03-30 – 2013-04-02 (×4): 200 mg via ORAL
  Filled 2013-03-30 (×4): qty 2
  Filled 2013-03-30: qty 28
  Filled 2013-03-30: qty 2

## 2013-03-30 NOTE — [facility] Suicide Risk Assessment (Signed)
Suicide Risk Assessment  Admission Assessment     Nursing information obtained from:  Patient Demographic factors:  Caucasian;Unemployed Current Mental Status:  Suicidal ideation indicated by patient Loss Factors:  Decline in physical health;Loss of significant relationship;Financial problems / change in socioeconomic status Historical Factors:  Prior suicide attempts;Family history of mental illness or substance abuse Risk Reduction Factors:  Responsible for children under 12 years of age;Sense of responsibility to family;Positive social support  CLINICAL FACTORS:   Severe Anxiety and/or Agitation Bipolar Disorder:   Depressive phase Depression:   Anhedonia Hopelessness Impulsivity Insomnia Recent sense of peace/wellbeing Severe Chronic Pain More than one psychiatric diagnosis Previous Psychiatric Diagnoses and Treatments Medical Diagnoses and Treatments/Surgeries  COGNITIVE FEATURES THAT CONTRIBUTE TO RISK:  Closed-mindedness Loss of executive function Polarized thinking Thought constriction (tunnel vision)    SUICIDE RISK:   Moderate:  Frequent suicidal ideation with limited intensity, and duration, some specificity in terms of plans, no associated intent, good self-control, limited dysphoria/symptomatology, some risk factors present, and identifiable protective factors, including available and accessible social support.  PLAN OF CARE: Admitted voluntarily, emergently from [facility] for bipolar depression with suicidal ideation without plan. She has been ran out of medications due to lapse in her insurance.   I certify that inpatient services furnished can reasonably be expected to improve the patient's condition.  Harriet Bollen,JANARDHAHA R. 03/30/2013, 10:41 AM

## 2013-03-30 NOTE — [facility] Group Notes (Signed)
[facility] LCSW Group Therapy  [facility] 1:15 - 2:30 PM  03/30/2013   Type of Therapy:  Group Therapy  Participation Level:  Minimal  Participation Quality:  Attentive  Affect:  Appropriate  Cognitive:  Appropriate  Insight:  Developing/Improving   Engagement in Therapy:  Developing/Improving   Modes of Intervention:  Discussion, Education, Exploration, Problem-Solving, Rapport Building, Support   Summary of Progress/Problems:  Patient listened attentively to speaker from [facility] but did not engage in discussion.  Wynn Banker 03/30/2013

## 2013-03-30 NOTE — Progress Notes (Signed)
D: Patient denies SI/HI and auditory and visual hallucinations. The patient has a depressed mood and affect. The patient reports that she has recently stopped her medications due to lack of funds. The patient also reports that she is depressed due to her mother's recent death (due to complications from gallbladder surgery). The patient is attending groups on the unit and is interacting appropriately within the milieu.  A: Patient given emotional support from RN. Patient encouraged to come to staff with concerns and/or questions. Patient's medication routine continued. Patient's orders and plan of care reviewed.  R: Patient remains cooperative. Will continue to monitor patient q15 minutes for safety.

## 2013-03-30 NOTE — Progress Notes (Signed)
Patient ID: Carla Gentry, female   DOB: [birth year], 32 y.o.   MRN: [id number] D: Patient in dayroom on approach. Pt mood/affect appeared depressed and flat. Pt stated is "feeling well and ready to go home". Pt denies any migraine or pain. Pt denies SI/HI/AVH. Pt attended evening karaoke group and was supportive of peers. Pt denies any needs or concerns.  Cooperative with assessment. No acute distressed noted at this time.   A: Met with pt 1:1. Medications administered as prescribed. Writer encouraged pt to discuss feelings. Pt encouraged to come to staff with any question or concerns.   R: Patient remains safe. She is complaint with medications and denies any adverse reaction. Continue current POC.

## 2013-03-30 NOTE — Progress Notes (Signed)
Nutrition Brief Note  Pt meets criteria for severe MALNUTRITION in the context of chronic illness as evidenced by <75% estimated energy intake in the past month with 9.2% weight loss in the past 3 months per pt report.  Patient identified on the Malnutrition Screening Tool (MST) Report.  Wt Readings from Last 10 Encounters:  03/29/13 119 lb (53.978 kg)   Body mass index is 24.02 kg/(m^2). Patient meets criteria for normal weight based on current BMI.   Discussed intake PTA with patient and compared to intake presently. Pt reports recently having nerve surgery. Says she was on Norco for the past year but stopped taking it a few weeks ago and has had diarrhea every time she eats since then. Reports 12 pound unintended weight loss since the end of July. Was started on Imodium today which she states is helping. Is not eating well during admission r/t fear of diarrhea coming back. Denies wanting any nutritional supplements.   Current diet order is regular and pt is also offered choice of unit snacks mid-morning and mid-afternoon.  Pt is eating as desired.   Labs and medications reviewed. Potassium slightly low, Alk phos elevated.   Nutrition Dx:  Unintended wt change r/t suboptimal oral intake AEB pt report  Interventions:   Discussed the importance of nutrition and encouraged intake of food and beverages.     Discussed nutrition therapy for diarrhea with recommended foods/beverages.    Multivitamin 1 tablet PO daily  No additional nutrition interventions warranted at this time. If nutrition issues arise, please consult RD.   Levon Hedger MS, RD, LDN [phone] Pager [phone] After Hours Pager

## 2013-03-30 NOTE — Progress Notes (Signed)
Adult Psychoeducational Group Note  Date:  03/30/2013 Time:  4:42 PM  Group Topic/Focus:  Building Self Esteem:   The Focus of this group is helping patients become aware of the effects of self-esteem on their lives, the things they and others do that enhance or undermine their self-esteem, seeing the relationship between their level of self-esteem and the choices they make and learning ways to enhance self-esteem.  Participation Level:  Active  Participation Quality:  Appropriate and Attentive  Affect:  Appropriate  Cognitive:  Alert and Appropriate  Insight: Good  Engagement in Group:  Engaged  Modes of Intervention:  Activity, Discussion, Exploration, Socialization and Support  Additional Comments:  Pt came to group and shared that money is effecting her self-esteem. Pt plans on changing this by not spending so much money on material things for her children, but rather spend time with them.  Carla Gentry 03/30/2013, 4:42 PM

## 2013-03-30 NOTE — [facility] Counselor (Signed)
Adult Comprehensive Assessment  Patient ID: Carla Gentry, female   DOB: 09-Mar-1981, 32 y.o.   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  Mother was was support  Living/Environment/Situation:  Living Arrangements: Spouse/significant other;Children Living conditions (as described by patient or guardian): Good How long has patient lived in current situation?: Severl months What is atmosphere in current home: Comfortable;Loving;Supportive  Family History:  Marital status: Married Number of Years Married: 0 What types of issues is patient dealing with in the relationship?: No problems.  Patient was married August 2014 Does patient have children?: Yes How many children?: 1 How is patient's relationship with their children?: Good relationship with 48 year old daugher  Childhood History:  By whom was/is the patient raised?: Both parents Additional childhood history information: very good childhood Description of patient's relationship with caregiver when they were a child: Very close relationship with mother - okay relationship with father Patient's description of current relationship with people who raised him/her: Okay relationship with father - mother deceased Does patient have siblings?: Yes Number of Siblings: 1 Description of patient's current relationship with siblings: Okayrelationship with brother who is 69 years older and  lives in [place] Did patient suffer any verbal/emotional/physical/sexual abuse as a child?: No Did patient suffer from severe childhood neglect?: No Has patient ever been sexually abused/assaulted/raped as an adolescent or adult?: No Was the patient ever a victim of a crime or a disaster?: No Witnessed domestic violence?: No Has patient been effected by domestic violence as an adult?: No  Education:  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 Learning disability?: No  Employment/Work Situation:   Employment situation: Unemployed Patient's job has been impacted by current illness: No What is the longest time patient has a held a job?: Five years Where was the patient employed at that time?: [employer] Has patient ever been in the [employer]?: No Has patient ever served in combat?: No  Financial Resources:   Financial resources: Income from spouse Does patient have a representative payee or guardian?: No  Alcohol/Substance Abuse:   What has been your use of drugs/alcohol within the last 12 months?: Reports drinking a glass of wine  Alcohol/Substance Abuse Treatment Hx: Denies past history Has alcohol/substance abuse ever caused legal problems?: No  Social Support System:   Forensic psychologist System: None Describe Community Support System: None Type of faith/religion: Episcopalian How does patient's faith help to cope with current illness?: Does not rely on her faith  Leisure/Recreation:   Leisure and Hobbies: Reading and spending time with her dauther  Strengths/Needs:   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  She will benefit from crisis stabilization, evaluation for medication, psycho-education groups for coping skills development, group therapy and case management for discharge planning.     Carla Gentry, Joesph July. 03/30/2013

## 2013-03-30 NOTE — Progress Notes (Signed)
Recreation Therapy Notes  Date: 10.02.2014 Time: 2:45pom Location: [address] Dayroom  Group Topic: Software engineer Activities (AAA)  Behavioral Response: Engaged, Attentive, Appropriate  Affect: Euthymic  Clinical Observations/Feedback: Dog Team: [employer] Patient interacted appropriately with peer, dog team, LRT and MHT.   Marykay Lex Catheryne Deford, LRT/CTRS  Brandilee Pies L 03/30/2013 5:22 PM

## 2013-03-30 NOTE — [facility] Suicide Risk Assessment (Signed)
[facility] INPATIENT:  Family/Significant Other Suicide Prevention Education  Suicide Prevention Education:  Education Completed; Carla Gentry, Husband, [phone]; has been identified by the patient as the family member/significant other with whom the patient will be residing, and identified as the person(s) who will aid the patient in the event of a mental health crisis (suicidal ideations/suicide attempt).  With written consent from the patient, the family member/significant other has been provided the following suicide prevention education, prior to the and/or following the discharge of the patient.  The suicide prevention education provided includes the following:  Suicide risk factors  Suicide prevention and interventions  National Suicide Hotline telephone number  [facility] assessment telephone number  [facility] Emergency Assistance 911  [facility] and/or Residential Mobile Crisis Unit telephone number  Request made of family/significant other to:  Remove weapons (e.g., guns, rifles, knives), all items previously/currently identified as safety concern.  Husband advised patient does not have access to guns.   Remove drugs/medications (over-the-counter, prescriptions, illicit drugs), all items previously/currently identified as a safety concern.  The family member/significant other verbalizes understanding of the suicide prevention education information provided.  The family member/significant other agrees to remove the items of safety concern listed above.  Carla Gentry 03/30/2013, 11:15 AM

## 2013-03-30 NOTE — Progress Notes (Signed)
Adult Psychoeducational Group Note  Date:  03/30/2013 Time:  10:00am Group Topic/Focus:  Therapeutic Activity-Boundaries  Participation Level:  Active  Participation Quality:  Appropriate and Attentive  Affect:  Appropriate  Cognitive:  Appropriate  Insight: Appropriate  Engagement in Group:  Engaged  Modes of Intervention:  Discussion and Education  Additional Comments:  Pt attended and participated in group. Pt stated boundaries are limits you set to protect yourself. When ask why limits are so hard to keep pt stated its hard to say no.  Shelly Bombard D 03/30/2013, 1:16 PM

## 2013-03-30 NOTE — H&P (Signed)
Psychiatric Admission Assessment Adult  Patient Identification:  Carla Gentry Date of Evaluation:  03/30/2013 Chief Complaint:  DEPRESSIVE DISORDER NOS ANXIETY DISORDER NOS  History of Present Illness: Carla Gentry is a 32 y.o. female patient who presented to [facility] with SI and worsening depression. She attributes SI and worsening depression to not taking her medications for 2 weeks. SI has been present x 2 weeks, intermittent, worse over the "last few days". She reports that she stopped taking her medications as she could not afford them as they were too expensive and she did not have insurance. She explains that she did not feel safe by herself and as a result she presented to [facility]. Patient resides in the home with her husband, however he is working and will not be at home in night. She reports that she had a plan to OD on pills. Her insurance will take affect 03/29/13 and she "should"" be able to purchase her medications. She reports taking her medications regularly. She expresses concern re: amount of time that it will take for medications to take effect once restarted. Patient denies SI, HI, or AVH. Patient reports symptoms of opioid withdrawal and requested medication to help with it. She has sweating, diarrhea, stomach cramps and nausea. She has one episode of OD about early part of this year but no psych admission required due to contracting with primary provider. Her mother passed away in summer due to complication of gall bladder surgery. She had a surgery on her occipital area for Occipital neuralgia and was treated at [facility] primary care in [facility] and headache provider in [place] at [facility].   Elements:  Location:  [facility] adult. Quality:  depression . Severity:  suicidal . Timing:  ran out of medication. Duration:  two weeks. 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 Associated Signs/Synptoms: Depression Symptoms:  depressed mood, anhedonia, insomnia, psychomotor  retardation, fatigue, feelings of worthlessness/guilt, difficulty concentrating, hopelessness, suicidal thoughts with specific plan, loss of energy/fatigue, weight loss, decreased labido, decreased appetite, (Hypo) Manic Symptoms:  Distractibility, Impulsivity, Anxiety Symptoms:  Excessive Worry, Psychotic Symptoms:  none PTSD Symptoms: NA  Psychiatric Specialty Exam: Physical Exam  ROS  Blood pressure 104/74, pulse 142, temperature 99.5 F (37.5 C), temperature source Oral, resp. rate 16, height 4\' 11"  (1.499 m), weight 53.978 kg (119 lb).Body mass index is 24.02 kg/(m^2).  General Appearance: Disheveled  Eye Solicitor::  Fair  Speech:  Clear and Coherent  Volume:  Normal  Mood:  Anxious, Depressed, Hopeless and Worthless  Affect:  Constricted and Depressed  Thought Process:  Coherent and Goal Directed  Orientation:  Full (Time, Place, and Person)  Thought Content:  WDL  Suicidal Thoughts:  Yes.  without intent/plan  Homicidal Thoughts:  No  Memory:  Immediate;   Fair  Judgement:  Intact  Insight:  Fair  Psychomotor Activity:  Psychomotor Retardation  Concentration:  Fair  Recall:  Fair  Akathisia:  NA  Handed:  Right  AIMS (if indicated):     Assets:  Communication Skills Desire for Improvement Financial Resources/Insurance Housing Intimacy Leisure Time Physical Health Resilience Social Support Talents/Skills Transportation  Sleep:  Number of Hours: 5.75    Past Psychiatric History: Diagnosis: Bipolar depression  Hospitalizations: NO  Outpatient Care: YES  Substance Abuse Care: NO  Self-Mutilation: NO  Suicidal Attempts: YES but no psych hospital  Violent Behaviors: NO   Past Medical History:   Past Medical History  Diagnosis Date  . Depression   .  Migraine   . Anxiety    None. Allergies:  No Known Allergies PTA Medications: Prescriptions prior to admission  Medication Sig Dispense Refill  . eletriptan (RELPAX) 20 MG tablet Take 20 mg by  mouth as needed for migraine. One tablet by mouth at onset of headache. May repeat in 2 hours if headache persists or recurs.      Marland Kitchen HYDROcodone-acetaminophen (NORCO) 10-325 MG per tablet Take 1 tablet by mouth every 6 (six) hours as needed for pain.      Marland Kitchen ibuprofen (ADVIL,MOTRIN) 200 MG tablet Take 800 mg by mouth every 8 (eight) hours as needed for pain.      Marland Kitchen lithium carbonate 300 MG capsule Take 900 mg by mouth at bedtime.      . pregabalin (LYRICA) 150 MG capsule Take 150-300 mg by mouth 2 (two) times daily. 1 cap in am, 2 caps in pm      . topiramate (TOPAMAX) 100 MG tablet Take 200 mg by mouth at bedtime.      Marland Kitchen venlafaxine XR (EFFEXOR-XR) 150 MG 24 hr capsule Take 300 mg by mouth daily.        Previous Psychotropic Medications:  Medication/Dose  Effexor XR  Lithium             Substance Abuse History in the last 12 months:  no  Consequences of Substance Abuse: NA  Social History:  reports that she has never smoked. She does not have any smokeless tobacco history on file. She reports that she does not drink alcohol. Her drug history is not on file. Additional Social History:   Current Place of Residence:   Place of Birth:   Family Members: Marital Status:  Married Children:  Sons:  Daughters: Relationships: Education:  Corporate treasurer Problems/Performance: Religious Beliefs/Practices: History of Abuse (Emotional/Phsycial/Sexual) Teacher, music History:  None. Legal History: Hobbies/Interests:  Family History:  History reviewed. No pertinent family history.  Results for orders placed during the hospital encounter of 03/28/13 (from the past 72 hour(s))  ACETAMINOPHEN LEVEL     Status: None   Collection Time    03/28/13  7:31 PM      Result Value Range   Acetaminophen (Tylenol), Serum <15.0  10 - 30 ug/mL   Comment:            THERAPEUTIC CONCENTRATIONS VARY     SIGNIFICANTLY. A RANGE OF 10-30     ug/mL MAY BE AN EFFECTIVE      CONCENTRATION FOR MANY PATIENTS.     HOWEVER, SOME ARE BEST TREATED     AT CONCENTRATIONS OUTSIDE THIS     RANGE.     ACETAMINOPHEN CONCENTRATIONS     >150 ug/mL AT 4 HOURS AFTER     INGESTION AND >50 ug/mL AT 12     HOURS AFTER INGESTION ARE     OFTEN ASSOCIATED WITH TOXIC     REACTIONS.  CBC     Status: Abnormal   Collection Time    03/28/13  7:31 PM      Result Value Range   WBC 14.2 (*) 4.0 - 10.5 K/uL   RBC 5.10  3.87 - 5.11 MIL/uL   Hemoglobin 13.3  12.0 - 15.0 g/dL   HCT 81.1  91.4 - 78.2 %   MCV 80.4  78.0 - 100.0 fL   MCH 26.1  26.0 - 34.0 pg   MCHC 32.4  30.0 - 36.0 g/dL   RDW 95.6 (*) 21.3 - 08.6 %  Platelets 441 (*) 150 - 400 K/uL  COMPREHENSIVE METABOLIC PANEL     Status: Abnormal   Collection Time    03/28/13  7:31 PM      Result Value Range   Sodium 132 (*) 135 - 145 mEq/L   Potassium 3.4 (*) 3.5 - 5.1 mEq/L   Chloride 99  96 - 112 mEq/L   CO2 15 (*) 19 - 32 mEq/L   Glucose, Bld 107 (*) 70 - 99 mg/dL   BUN 9  6 - 23 mg/dL   Creatinine, Ser 1.61  0.50 - 1.10 mg/dL   Calcium 9.7  8.4 - 09.6 mg/dL   Total Protein 7.5  6.0 - 8.3 g/dL   Albumin 3.8  3.5 - 5.2 g/dL   AST 21  0 - 37 U/L   ALT 22  0 - 35 U/L   Alkaline Phosphatase 136 (*) 39 - 117 U/L   Total Bilirubin 0.3  0.3 - 1.2 mg/dL   GFR calc non Af Amer >90  >90 mL/min   GFR calc Af Amer >90  >90 mL/min   Comment: (NOTE)     The eGFR has been calculated using the CKD EPI equation.     This calculation has not been validated in all clinical situations.     eGFR's persistently <90 mL/min signify possible Chronic Kidney     Disease.  ETHANOL     Status: None   Collection Time    03/28/13  7:31 PM      Result Value Range   Alcohol, Ethyl (B) <11  0 - 11 mg/dL   Comment:            LOWEST DETECTABLE LIMIT FOR     SERUM ALCOHOL IS 11 mg/dL     FOR MEDICAL PURPOSES ONLY  SALICYLATE LEVEL     Status: Abnormal   Collection Time    03/28/13  7:31 PM      Result Value Range   Salicylate Lvl 2.7 (*)  2.8 - 20.0 mg/dL  LITHIUM LEVEL     Status: Abnormal   Collection Time    03/28/13  8:05 PM      Result Value Range   Lithium Lvl 0.25 (*) 0.80 - 1.40 mEq/L  URINE RAPID DRUG SCREEN (HOSP PERFORMED)     Status: Abnormal   Collection Time    03/28/13  8:45 PM      Result Value Range   Opiates NONE DETECTED  NONE DETECTED   Cocaine NONE DETECTED  NONE DETECTED   Benzodiazepines POSITIVE (*) NONE DETECTED   Amphetamines NONE DETECTED  NONE DETECTED   Tetrahydrocannabinol NONE DETECTED  NONE DETECTED   Barbiturates NONE DETECTED  NONE DETECTED   Comment:            DRUG SCREEN FOR MEDICAL PURPOSES     ONLY.  IF CONFIRMATION IS NEEDED     FOR ANY PURPOSE, NOTIFY LAB     WITHIN 5 DAYS.                LOWEST DETECTABLE LIMITS     FOR URINE DRUG SCREEN     Drug Class       Cutoff (ng/mL)     Amphetamine      1000     Barbiturate      200     Benzodiazepine   200     Tricyclics       300     Opiates  300     Cocaine          300     THC              50  POCT PREGNANCY, URINE     Status: None   Collection Time    03/28/13  9:04 PM      Result Value Range   Preg Test, Ur NEGATIVE  NEGATIVE   Comment:            THE SENSITIVITY OF THIS     METHODOLOGY IS >24 mIU/mL   Psychological Evaluations:  Assessment:   DSM5:  Schizophrenia Disorders:   Obsessive-Compulsive Disorders:   Trauma-Stressor Disorders:   Substance/Addictive Disorders:   Depressive Disorders:    AXIS I:  Bipolar, Depressed and Opioid withdrawal AXIS II:  Deferred AXIS III:   Past Medical History  Diagnosis Date  . Depression   . Migraine   . Anxiety    AXIS IV:  economic problems, occupational problems, other psychosocial or environmental problems and problems related to social environment AXIS V:  41-50 serious symptoms  Treatment Plan/Recommendations:  Admitted voluntarily, emergently from [facility] for bipolar depression and opioid withdrawal and suicidal ideation with plan but no intention.  She has stopped her medication due to lapses in her insurance and financial issues. She needs crisis stabilization, safety monitoring and medication adjustments.   Treatment Plan Summary: Daily contact with patient to assess and evaluate symptoms and progress in treatment Medication management Current Medications:  Current Facility-Administered Medications  Medication Dose Route Frequency Provider Last Rate Last Dose  . acetaminophen (TYLENOL) tablet 650 mg  650 mg Oral Q6H PRN Audrea Muscat, NP   650 mg at 03/30/13 0611  . alum & mag hydroxide-simeth (MAALOX/MYLANTA) 200-200-20 MG/5ML suspension 30 mL  30 mL Oral Q4H PRN Evanna Janann August, NP      . dicyclomine (BENTYL) tablet 20 mg  20 mg Oral Q6H PRN Nehemiah Settle, MD      . eletriptan (RELPAX) tablet 20 mg  20 mg Oral Q2H PRN Nehemiah Settle, MD      . hydrOXYzine (ATARAX/VISTARIL) tablet 25 mg  25 mg Oral Q6H PRN Nehemiah Settle, MD      . influenza vac split quadrivalent PF (FLUARIX) injection 0.5 mL  0.5 mL Intramuscular Tomorrow-1000 Nehemiah Settle, MD      . lithium carbonate (ESKALITH) CR tablet 900 mg  900 mg Oral QHS Nehemiah Settle, MD      . loperamide (IMODIUM) capsule 2-4 mg  2-4 mg Oral PRN Nehemiah Settle, MD      . magnesium hydroxide (MILK OF MAGNESIA) suspension 30 mL  30 mL Oral Daily PRN Evanna Janann August, NP      . methocarbamol (ROBAXIN) tablet 500 mg  500 mg Oral Q8H PRN Nehemiah Settle, MD      . naproxen (NAPROSYN) tablet 500 mg  500 mg Oral BID PRN Nehemiah Settle, MD      . ondansetron (ZOFRAN-ODT) disintegrating tablet 4 mg  4 mg Oral Q6H PRN Nehemiah Settle, MD      . pregabalin (LYRICA) capsule 100 mg  100 mg Oral BID PRN Nehemiah Settle, MD      . topiramate (TOPAMAX) tablet 200 mg  200 mg Oral QHS Nehemiah Settle, MD      . traZODone (DESYREL) tablet 50 mg  50 mg Oral QHS PRN,MR X 1 Evanna  Janann August, NP  50 mg at 03/29/13 2230  . [START ON 03/31/2013] venlafaxine XR (EFFEXOR-XR) 24 hr capsule 150 mg  150 mg Oral Q breakfast Nehemiah Settle, MD        Observation Level/Precautions:  15 minute checks  Laboratory:  Reviewed labs at admission  Psychotherapy:  Group and milieu therapy  Medications:  See Above  Consultations:  consult  Discharge Concerns:  safety  Estimated LOS: 3-5 days  Other:     I certify that inpatient services furnished can reasonably be expected to improve the patient's condition.   Sumi Lye,JANARDHAHA R. 10/2/201410:43 AM

## 2013-03-31 DIAGNOSIS — F1110 Opioid abuse, uncomplicated: Secondary | ICD-10-CM

## 2013-03-31 NOTE — [facility] Group Notes (Signed)
[facility] LCSW Aftercare Discharge Planning Group Note   03/31/2013 1:13 PM    Participation Quality:  Appropraite  Mood/Affect:  Appropriate  Depression Rating:  1  Anxiety Rating:  1  Thoughts of Suicide:  No  Will you contract for safety?   NA  Current AVH:  No  Plan for Discharge/Comments:  Patient attending discharge planning group and actively participated in group. Patient reports being much better this morning.  She advised of having outpatient services through [facility].    CSW provided all participants with daily workbook and information on services offered by [facility].   Transportation Means: Patient has transportation.   Supports:  Patient has a support system.   Marquin Patino, Joesph July

## 2013-03-31 NOTE — Tx Team (Signed)
Interdisciplinary Treatment Plan Update   Date Reviewed:  03/31/2013  Time Reviewed:  10:40 AM  Progress in Treatment:   Attending groups: Yes Participating in groups: Yes Taking medication as prescribed: Yes  Tolerating medication: Yes Family/Significant other contact made: Yes  Patient understands diagnosis: Yes, contact made with husband  Discussing patient identified problems/goals with staff: Yes Medical problems stabilized or resolved: Yes Denies suicidal/homicidal ideation: Yes Patient has not harmed self or others: Yes  For review of initial/current patient goals, please see plan of care.  Estimated Length of Stay:  3-4 days  Reasons for Continued Hospitalization:  Anxiety Depression Medication stabilization   New Problems/Goals identified:    Discharge Plan or Barriers:   Home with outpatient follow up  Additional Comments:  Attendees:  Patient:  03/31/2013 10:40 AM   Signature: Mervyn Gay, MD 03/31/2013 10:40 AM  Signature:  Dara Hoyer, PA 03/31/2013 10:40 AM  Signature: Harold Barban, RN 03/31/2013 10:40 AM  Signature:  03/31/2013 10:40 AM  Signature:   03/31/2013 10:40 AM  Signature:  Juline Patch, LCSW 03/31/2013 10:40 AM  Signature:  Reyes Ivan, LCSW 03/31/2013 10:40 AM  Signature:  Maseta Dorley,Care Coordinator 03/31/2013 10:40 AM  Signature: 03/31/2013 10:40 AM  Signature: 03/31/2013  10:40 AM  Signature:    Signature:      Scribe for Treatment Team:   Juline Patch,  03/31/2013 10:40 AM

## 2013-03-31 NOTE — [facility] Group Notes (Signed)
Adult Psychoeducational Group Note  Date:  03/31/2013 Time:  9:24 PM  Group Topic/Focus:  Wrap-Up Group:   The focus of this group is to help patients review their daily goal of treatment and discuss progress on daily workbooks.  Participation Level:  Minimal  Participation Quality:  Appropriate  Affect:  Appropriate  Cognitive:  Appropriate  Insight: Appropriate  Engagement in Group:  Limited  Modes of Intervention:  Discussion  Additional Comments:  Carla Gentry expressed that her day was better.  She also stated the symptoms from her medications got better.  Caroll Rancher A 03/31/2013, 9:24 PM

## 2013-03-31 NOTE — Progress Notes (Signed)
Adult Psychoeducational Group Note  Date:  03/31/2013 Time:  2:14 PM  Group Topic/Focus:  Early Warning Signs:   The focus of this group is to help patients identify signs or symptoms they exhibit before slipping into an unhealthy state or crisis.  Participation Level:  Active  Participation Quality:  Appropriate and Attentive  Affect:  Appropriate  Cognitive:  Alert and Appropriate  Insight: Good  Engagement in Group:  Engaged  Modes of Intervention:  Discussion, Exploration, Socialization and Support  Additional Comments:  Pt came to group and shared that over-sleeping and withdrawing were her two early warning signs for relapse.   Cathlean Cower 03/31/2013, 2:14 PM

## 2013-03-31 NOTE — Progress Notes (Signed)
D: Patient denies SI/HI and auditory and visual hallucinations. The patient has a depressed mood and affect. The patient states that she is "feeling better today" and rates her depression and anxiety both a 4 out of 10 (1 low/10 high). The patient is working on relapse prevention and reports that she needs to "keep taking her medications" in order to have a healthier life. The patient is attending groups and interacting appropriately within the milieu.  A: Patient given emotional support from RN. Patient encouraged to come to staff with concerns and/or questions. Patient's medication routine continued. Patient's orders and plan of care reviewed.  R: Patient remains cooperative. Will continue to monitor patient q15 minutes for safety.

## 2013-03-31 NOTE — Progress Notes (Signed)
[facility] MD Progress Note  03/31/2013 12:48 PM Carla Gentry  MRN:  [id number] Subjective:  The patient has been compliant with her medication without adverse effects. Patient reported she has ba dreams last night and thinking he might be side effect of trazodone the patient is willing to take hydroxyzine syrup trazodone tonight. Patient reported she has no body pains nausea and vomiting. She endorses having the sweating DOS night even though she did not have any bed sheets covered. She continued to have a thoughts about safety but contracts for safety contract in the hospital. Patient also reported she does not want her children's father to struggle with them and she was hospitalized. She is hoping to be discharged soon  Diagnosis:   DSM5: Schizophrenia Disorders:   Obsessive-Compulsive Disorders:   Trauma-Stressor Disorders:   Substance/Addictive Disorders:   Depressive Disorders:    Axis I: Bipolar, Depressed and Opiates withdrawal  ADL's:  Intact  Sleep: Fair  Appetite:  Fair  Suicidal Ideation:  Patient has suicidal thoughts but contracted for safety Homicidal Ideation:  Denied AEB (as evidenced by):  Psychiatric Specialty Exam: ROS  Blood pressure 100/66, pulse 101, temperature 97.5 F (36.4 C), temperature source Oral, resp. rate 16, height 4\' 11"  (1.499 m), weight 53.978 kg (119 lb).Body mass index is 24.02 kg/(m^2).  General Appearance: Casual and Fairly Groomed  Patent attorney::  Fair  Speech:  Clear and Coherent  Volume:  Decreased  Mood:  Anxious, Depressed, Hopeless and Worthless  Affect:  Constricted and Depressed  Thought Process:  Coherent and Goal Directed  Orientation:  Full (Time, Place, and Person)  Thought Content:  Rumination  Suicidal Thoughts:  Yes.  without intent/plan  Homicidal Thoughts:  No  Memory:  Immediate;   Fair  Judgement:  Intact  Insight:  Fair  Psychomotor Activity:  Psychomotor Retardation  Concentration:  Fair  Recall:  Good  Akathisia:   NA  Handed:  Right  AIMS (if indicated):     Assets:  Communication Skills Desire for Improvement Housing Intimacy Physical Health Resilience Social Support Transportation  Sleep:  Number of Hours: 5.5   Current Medications: Current Facility-Administered Medications  Medication Dose Route Frequency Provider Last Rate Last Dose  . acetaminophen (TYLENOL) tablet 650 mg  650 mg Oral Q6H PRN Audrea Muscat, NP   650 mg at 03/30/13 0611  . alum & mag hydroxide-simeth (MAALOX/MYLANTA) 200-200-20 MG/5ML suspension 30 mL  30 mL Oral Q4H PRN Evanna Janann August, NP      . dicyclomine (BENTYL) tablet 20 mg  20 mg Oral Q6H PRN Nehemiah Settle, MD      . eletriptan (RELPAX) tablet 20 mg  20 mg Oral Q2H PRN Nehemiah Settle, MD      . hydrOXYzine (ATARAX/VISTARIL) tablet 25 mg  25 mg Oral Q6H PRN Nehemiah Settle, MD      . lithium carbonate (ESKALITH) CR tablet 900 mg  900 mg Oral QHS Nehemiah Settle, MD   900 mg at 03/30/13 2204  . loperamide (IMODIUM) capsule 2-4 mg  2-4 mg Oral PRN Nehemiah Settle, MD   4 mg at 03/30/13 1152  . magnesium hydroxide (MILK OF MAGNESIA) suspension 30 mL  30 mL Oral Daily PRN Evanna Janann August, NP      . methocarbamol (ROBAXIN) tablet 500 mg  500 mg Oral Q8H PRN Nehemiah Settle, MD      . multivitamin with minerals tablet 1 tablet  1 tablet Oral Daily Lavena Bullion,  RD      . naproxen (NAPROSYN) tablet 500 mg  500 mg Oral BID PRN Nehemiah Settle, MD      . ondansetron (ZOFRAN-ODT) disintegrating tablet 4 mg  4 mg Oral Q6H PRN Nehemiah Settle, MD      . pregabalin (LYRICA) capsule 100 mg  100 mg Oral BID PRN Nehemiah Settle, MD      . topiramate (TOPAMAX) tablet 200 mg  200 mg Oral QHS Nehemiah Settle, MD   200 mg at 03/30/13 2204  . traZODone (DESYREL) tablet 50 mg  50 mg Oral QHS PRN,MR X 1 Evanna Cori Merry Proud, NP   50 mg at 03/31/13 0057  . venlafaxine XR  (EFFEXOR-XR) 24 hr capsule 150 mg  150 mg Oral Q breakfast Nehemiah Settle, MD   150 mg at 03/31/13 1610    Lab Results: No results found for this or any previous visit (from the past 48 hour(s)).  Physical Findings: AIMS: Facial and Oral Movements Muscles of Facial Expression: None, normal Lips and Perioral Area: None, normal Jaw: None, normal Tongue: None, normal,Extremity Movements Upper (arms, wrists, hands, fingers): None, normal Lower (legs, knees, ankles, toes): None, normal, Trunk Movements Neck, shoulders, hips: None, normal, Overall Severity Severity of abnormal movements (highest score from questions above): None, normal Incapacitation due to abnormal movements: None, normal Patient's awareness of abnormal movements (rate only patient's report): No Awareness, Dental Status Current problems with teeth and/or dentures?: No Does patient usually wear dentures?: No  CIWA:  CIWA-Ar Total: 1 COWS:     Treatment Plan Summary: Daily contact with patient to assess and evaluate symptoms and progress in treatment Medication management  Plan: Continue current treatment plan and medication management Treatment Plan/Recommendations:   1. Admit for crisis management and stabilization. 2. Medication management to reduce current symptoms to base line and improve the patient's overall level of functioning. 3. Treat health problems as indicated. 4. Develop treatment plan to decrease risk of relapse upon discharge and to reduce the need for readmission. 5. Psycho-social education regarding relapse prevention and self care. 6. Health care follow up as needed for medical problems. 7. Restart home medications where appropriate. 8. disposition plans are in progress  Medical Decision Making Problem Points:  Established problem, worsening (2), New problem, with no additional work-up planned (3), Review of last therapy session (1) and Review of psycho-social stressors (1) Data  Points:  Review or order clinical lab tests (1) Review or order medicine tests (1) Review of medication regiment & side effects (2) Review of new medications or change in dosage (2)  I certify that inpatient services furnished can reasonably be expected to improve the patient's condition.   [name],JANARDHAHA R. 03/31/2013, 12:48 PM

## 2013-03-31 NOTE — Progress Notes (Signed)
Patient ID: Carla Gentry, female   DOB: Nov 06, 1980, 32 y.o.   MRN: [id number] D: Patient in dayroom on approach. Pt mood/affect appeared depressed and flat.  Pt stated "having anxiety about discharge on Monday because of childcare issues with her ex husband". Pt stated "refuse to tell ex husband she is in a psych hospital because he might use it against her one day". Pt denies SI/HI/AVH and pain. Pt attended evening wrap up group and engaged in discussion. Pt denies any needs or concerns.  Cooperative with assessment. No acute distressed noted at this time.   A: Met with pt 1:1. Medications administered as prescribed. Writer encouraged pt to discuss feelings. Pt encouraged to come to staff with any question or concerns.   R: Patient remains safe. She is complaint with medications and denies any adverse reaction. Continue current POC.

## 2013-03-31 NOTE — [facility] Group Notes (Signed)
[facility] LCSW Group Therapy  Feelings Around Relapse 1:15 -2:30        03/31/2013  1:14 PM   Type of Therapy:  Group Therapy  Participation Level:  Appropriate  Participation Quality:  Appropriate  Affect:  Appropriate  Cognitive:  Attentive Appropriate  Insight:  Developing/Improving  Engagement in Therapy: Developing/Improving  Modes of Intervention:  Discussion Exploration Problem-Solving Supportive  Summary of Progress/Problems:  The topic for today was feelings around relapse.    Patient listened attentively but did not participate in discussion.    Wynn Banker 03/31/2013 1:14 PM

## 2013-04-01 NOTE — Progress Notes (Signed)
[facility] MD Progress Note  04/01/2013 5:24 PM Carla Gentry  MRN:  [id number] Subjective:  Patient sates she feels "good", she had been off her medications for two weeks due to loss of insurance and waiting for her husbands to start on 03/29/13.  Her mood has stabilized with no suicidal/homicidal ideations, sleep and appetite have improved, low levels of anxiety and depression.  She would like to discharge tomorrow because she feels stable and has been at the hospital since Tuesday evening, did not have withdrawal symptoms last night or today, she has childcare issues on Monday and needs to be home--appointment for follow-up already in place, will notify MD for possible discharge tomorrow.  Diagnosis:   DSM5:  Substance/Addictive Disorders:  Opioid Disorder - Severe (304.00)  Axis I: Anxiety Disorder NOS, Bipolar, Depressed and Substance Abuse Axis II: Deferred Axis III:  Past Medical History  Diagnosis Date  . Depression   . Migraine   . Anxiety    Axis IV: other psychosocial or environmental problems, problems related to social environment and problems with primary support group Axis V: 41-50 serious symptoms  ADL's:  Intact  Sleep: Good  Appetite:  Good  Suicidal Ideation:  Denies  Homicidal Ideation:  Denies  Psychiatric Specialty Exam: Review of Systems  Constitutional: Negative.   HENT: Negative.   Eyes: Negative.   Respiratory: Negative.   Cardiovascular: Negative.   Gastrointestinal: Negative.   Genitourinary: Negative.   Musculoskeletal: Negative.   Skin: Negative.   Neurological: Negative.   Endo/Heme/Allergies: Negative.   Psychiatric/Behavioral: Positive for depression. The patient is nervous/anxious.     Blood pressure 126/81, pulse 96, temperature 97.6 F (36.4 C), temperature source Oral, resp. rate 16, height 4\' 11"  (1.499 m), weight 53.978 kg (119 lb).Body mass index is 24.02 kg/(m^2).  General Appearance: Casual  Eye Contact::  Fair  Speech:  Normal  Rate  Volume:  Normal  Mood:  Anxious and Depressed  Affect:  Congruent  Thought Process:  Coherent  Orientation:  Full (Time, Place, and Person)  Thought Content:  WDL  Suicidal Thoughts:  No  Homicidal Thoughts:  No  Memory:  Immediate;   Fair Recent;   Fair Remote;   Fair  Judgement:  Fair  Insight:  Fair  Psychomotor Activity:  Decreased  Concentration:  Fair  Recall:  Fair  Akathisia:  No  Handed:  Right  AIMS (if indicated):     Assets:  Physical Health Resilience Social Support  Sleep:  Number of Hours: 6.5   Current Medications: Current Facility-Administered Medications  Medication Dose Route Frequency Provider Last Rate Last Dose  . acetaminophen (TYLENOL) tablet 650 mg  650 mg Oral Q6H PRN Audrea Muscat, NP   650 mg at 03/30/13 0611  . alum & mag hydroxide-simeth (MAALOX/MYLANTA) 200-200-20 MG/5ML suspension 30 mL  30 mL Oral Q4H PRN Evanna Janann August, NP      . dicyclomine (BENTYL) tablet 20 mg  20 mg Oral Q6H PRN Nehemiah Settle, MD      . eletriptan (RELPAX) tablet 20 mg  20 mg Oral Q2H PRN Nehemiah Settle, MD      . hydrOXYzine (ATARAX/VISTARIL) tablet 25 mg  25 mg Oral Q6H PRN Nehemiah Settle, MD      . lithium carbonate (ESKALITH) CR tablet 900 mg  900 mg Oral QHS Nehemiah Settle, MD   900 mg at 03/31/13 2104  . loperamide (IMODIUM) capsule 2-4 mg  2-4 mg Oral PRN Nehemiah Settle, MD  4 mg at 03/30/13 1152  . magnesium hydroxide (MILK OF MAGNESIA) suspension 30 mL  30 mL Oral Daily PRN Evanna Janann August, NP      . methocarbamol (ROBAXIN) tablet 500 mg  500 mg Oral Q8H PRN Nehemiah Settle, MD      . multivitamin with minerals tablet 1 tablet  1 tablet Oral Daily Lavena Bullion, RD      . naproxen (NAPROSYN) tablet 500 mg  500 mg Oral BID PRN Nehemiah Settle, MD      . ondansetron (ZOFRAN-ODT) disintegrating tablet 4 mg  4 mg Oral Q6H PRN Nehemiah Settle, MD      .  pregabalin (LYRICA) capsule 100 mg  100 mg Oral BID PRN Nehemiah Settle, MD      . topiramate (TOPAMAX) tablet 200 mg  200 mg Oral QHS Nehemiah Settle, MD   200 mg at 03/31/13 2104  . traZODone (DESYREL) tablet 50 mg  50 mg Oral QHS PRN,MR X 1 Evanna Cori Merry Proud, NP   50 mg at 03/31/13 0057  . venlafaxine XR (EFFEXOR-XR) 24 hr capsule 150 mg  150 mg Oral Q breakfast Nehemiah Settle, MD   150 mg at 04/01/13 0800    Lab Results: No results found for this or any previous visit (from the past 48 hour(s)).  Physical Findings: AIMS: Facial and Oral Movements Muscles of Facial Expression: None, normal Lips and Perioral Area: None, normal Jaw: None, normal Tongue: None, normal,Extremity Movements Upper (arms, wrists, hands, fingers): None, normal Lower (legs, knees, ankles, toes): None, normal, Trunk Movements Neck, shoulders, hips: None, normal, Overall Severity Severity of abnormal movements (highest score from questions above): None, normal Incapacitation due to abnormal movements: None, normal Patient's awareness of abnormal movements (rate only patient's report): No Awareness, Dental Status Current problems with teeth and/or dentures?: No Does patient usually wear dentures?: No  CIWA:  CIWA-Ar Total: 0 COWS:     Treatment Plan Summary: Daily contact with patient to assess and evaluate symptoms and progress in treatment Medication management  Plan:  Review of chart, vital signs, medications, and notes. 1-Individual and group therapy 2-Medication management for depression, opiate abuse, and anxiety:  Medications reviewed with the patient and she stated no untoward effects, no changes made 3-Coping skills for depression, anxiety, and opiate abuse 4-Continue crisis stabilization and management 5-Address health issues--monitoring vital signs, stable 6-Treatment plan in progress to prevent relapse of depression, opiate abuse, and anxiety  Medical Decision  Making Problem Points:  Established problem, stable/improving (1) and Review of psycho-social stressors (1) Data Points:  Review of medication regiment & side effects (2)  I certify that inpatient services furnished can reasonably be expected to improve the patient's condition.   Nanine Means, PMH-NP 04/01/2013, 5:24 PM

## 2013-04-01 NOTE — Progress Notes (Signed)
Adult Psychoeducational Group Note  Date:  04/01/2013 Time:  8:00 pm  Group Topic/Focus:  Wrap-Up Group:   The focus of this group is to help patients review their daily goal of treatment and discuss progress on daily workbooks.  Participation Level:  Active  Participation Quality:  Appropriate  Affect:  Appropriate  Cognitive:  Appropriate  Insight: Appropriate  Engagement in Group:  Engaged  Modes of Intervention:  Discussion, Education, Socialization and Support  Additional Comments:  Pt stated that she is in the hospital for depression. Pt stated that she is good mom and that she is good friend who is always there for others to talk to when asked to name two positive character traits.   Ogden Handlin 04/01/2013, 11:17 PM

## 2013-04-01 NOTE — [facility] Group Notes (Signed)
[facility] Group Notes:  (Nursing/MHT/Case Management/Adjunct)  Date:  04/01/2013  Time:  3:14 PM  Type of Therapy: Psychoeducational Skills  Participation Level: Active  Participation Quality: Appropriate  Affect: Appropriate  Cognitive: Appropriate  Insight: Appropriate  Engagement in Group: Engaged  Modes of Intervention: Problem-solving  Summary of Progress/Problems: Pt attended healthy coping skills group and engaged in treatment.   Jacquelyne Balint Shanta 04/01/2013, 3:14 PM

## 2013-04-01 NOTE — [facility] Group Notes (Signed)
[facility] Group Notes:  (Clinical Social Work)  04/01/2013   3:00-4:00PM  Summary of Progress/Problems:   The main focus of today's process group was for the patient to identify ways in which they have sabotaged their own mental health wellness/recovery.  Motivational interviewing was used to explore the reasons they engage in this behavior, and reasons they may have for wanting to change.  The Stages of Change were explained to the group using a handout, and patients identified where they are with regard to changing self-defeating behaviors.  The patient expressed that she self sabotages by sleeping too much, which has the benefit for her of allowing her to not think about all the problems she has, not dream about things, but just escape.  She wants to change to get healthier, because she feels that this just plays into her illness and makes her worse.  Her daughter is also old enough to start noticing.  She stated she needs to change.  Type of Therapy:  Process Group  Participation Level:  Active  Participation Quality:  Attentive  Affect:  Blunted and Depressed  Cognitive:  Oriented  Insight:  Engaged  Engagement in Therapy:  Engaged  Modes of Intervention:  Education, Motivational Interviewing   Ambrose Mantle, LCSW 04/01/2013, 5:16 PM

## 2013-04-01 NOTE — Progress Notes (Signed)
Patient ID: Carla Gentry, female   DOB: April 09, 1981, 32 y.o.   MRN: [id number]  D: Pt has been appropriate on the unit today, pt reported that she wanted to be discharged because she is having child care issues. Pt reported that she feels much better and is looking forward to getting back to her daughter.  Pt reported being negative SI/HI, no AH/VH noted. A: 15 min checks continued for patient safety. R: Pt safety maintained.

## 2013-04-01 NOTE — [facility] Group Notes (Signed)
[facility] Group Notes:  (Nursing/MHT/Case Management/Adjunct)  Date:  04/01/2013  Time:  3:04 PM  Type of Therapy:  Psychoeducational Skills  Participation Level:  Active  Participation Quality:  Appropriate  Affect:  Appropriate  Cognitive:  Appropriate  Insight:  Appropriate  Engagement in Group:  Engaged  Modes of Intervention:  Problem-solving  Summary of Progress/Problems: Pt attended self inventory group and engaged in treatment.  Jacquelyne Balint Shanta 04/01/2013, 3:04 PM

## 2013-04-01 NOTE — Progress Notes (Signed)
NP note reviewed, and I concur with treatment plan at this time.

## 2013-04-02 DIAGNOSIS — F411 Generalized anxiety disorder: Secondary | ICD-10-CM

## 2013-04-02 DIAGNOSIS — F1910 Other psychoactive substance abuse, uncomplicated: Secondary | ICD-10-CM

## 2013-04-02 NOTE — [facility] Group Notes (Signed)
[facility] Group Notes:  (Nursing/MHT/Case Management/Adjunct)  Date:  04/02/2013  Time:  6:46 PM  Type of Therapy:  Psychoeducational Skills  Participation Level:  Active  Participation Quality:  Appropriate  Affect:  Appropriate  Cognitive:  Appropriate  Insight:  Appropriate  Engagement in Group:  Engaged  Modes of Intervention:  Problem-solving  Summary of Progress/Problems: Pt attended healthy coping skills group and also engaged in the positive self talk activity.   Jacquelyne Balint Shanta 04/02/2013, 6:46 PM

## 2013-04-02 NOTE — Progress Notes (Signed)
PA note reviewed, and I agree with plan.  

## 2013-04-02 NOTE — Progress Notes (Signed)
Patient ID: Carla Gentry, female   DOB: 1981/01/29, 32 y.o.   MRN: [id number]  D: Pt has been appropriate on the unit today, pt reported that she is ready for discharge and that she is looking forward to going home tomorrow. Pt reported that she is not as depressed as she was and that she now has hope. Pt reported that she feels great off of the pain medication, and that she is looking forward to her new life. Pt reported being negative SI/HI, no AH/VH noted.  A: 15 min checks continued for patient safety. R: Pt safety maintained.

## 2013-04-02 NOTE — Progress Notes (Signed)
Patient ID: Carla Gentry, female   DOB: [birth year], 32 y.o.   MRN: [id number] D)  Has been pleasant, conversational, talking about working at [employer].  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  States feeling better, denies thoughts of self harm, working on developing support, misses her mother.  States she is pretty much over w/d sx but had had diarrhea earlier today.  Attended group, interacting appropriately with staff and peers. A)  Will continue to monitor for safety, continue POC R)  Safety maintained.

## 2013-04-02 NOTE — Progress Notes (Signed)
Patient ID: Carla Gentry, female   DOB: 1980-12-08, 32 y.o.   MRN: [id number] [facility] MD Progress Note  04/02/2013 11:18 AM Carla Gentry  MRN:  [id number] Subjective:  Carla Gentry states she is doing pretty good today and now realizes what she thought were symptoms of depression were in reality withdrawal symptoms from the opiates she has been on 3x a day for years. She feels much better and is feeling that she is ready to discharge out. Diagnosis:   DSM5:  Substance/Addictive Disorders:  Opioid Disorder - Severe (304.00)  Axis I: Anxiety Disorder NOS, Bipolar, Depressed and Substance Abuse Axis II: Deferred Axis III:  Past Medical History  Diagnosis Date  . Depression   . Migraine   . Anxiety    Axis IV: other psychosocial or environmental problems, problems related to social environment and problems with primary support group Axis V: 41-50 serious symptoms  ADL's:  Intact  Sleep: Good  Appetite:  Good  Suicidal Ideation:  Denies  Homicidal Ideation:  Denies  Psychiatric Specialty Exam: Review of Systems  Constitutional: Negative.   HENT: Negative.   Eyes: Negative.   Respiratory: Negative.   Cardiovascular: Negative.   Gastrointestinal: Negative.   Genitourinary: Negative.   Musculoskeletal: Negative.   Skin: Negative.   Neurological: Negative.   Endo/Heme/Allergies: Negative.   Psychiatric/Behavioral: Positive for depression. The patient is nervous/anxious.     Blood pressure 119/85, pulse 101, temperature 97.5 F (36.4 C), temperature source Oral, resp. rate 16, height 4\' 11"  (1.499 m), weight 53.978 kg (119 lb).Body mass index is 24.02 kg/(m^2).  General Appearance: Casual  Eye Contact::  Fair  Speech:  Normal Rate  Volume:  Normal  Mood:  Anxious and Depressed  Affect:  Congruent  Thought Process:  Coherent  Orientation:  Full (Time, Place, and Person)  Thought Content:  WDL  Suicidal Thoughts:  No  Homicidal Thoughts:  No  Memory:  Immediate;    Fair Recent;   Fair Remote;   Fair  Judgement:  Fair  Insight:  Fair  Psychomotor Activity:  Decreased  Concentration:  Fair  Recall:  Fair  Akathisia:  No  Handed:  Right  AIMS (if indicated):     Assets:  Physical Health Resilience Social Support  Sleep:  Number of Hours: 5.75   Current Medications: Current Facility-Administered Medications  Medication Dose Route Frequency Provider Last Rate Last Dose  . acetaminophen (TYLENOL) tablet 650 mg  650 mg Oral Q6H PRN Audrea Muscat, NP   650 mg at 03/30/13 0611  . alum & mag hydroxide-simeth (MAALOX/MYLANTA) 200-200-20 MG/5ML suspension 30 mL  30 mL Oral Q4H PRN Evanna Janann August, NP      . dicyclomine (BENTYL) tablet 20 mg  20 mg Oral Q6H PRN Nehemiah Settle, MD      . eletriptan (RELPAX) tablet 20 mg  20 mg Oral Q2H PRN Nehemiah Settle, MD      . hydrOXYzine (ATARAX/VISTARIL) tablet 25 mg  25 mg Oral Q6H PRN Nehemiah Settle, MD   25 mg at 04/01/13 2128  . lithium carbonate (ESKALITH) CR tablet 900 mg  900 mg Oral QHS Nehemiah Settle, MD   900 mg at 04/01/13 2129  . loperamide (IMODIUM) capsule 2-4 mg  2-4 mg Oral PRN Nehemiah Settle, MD   4 mg at 04/02/13 0141  . magnesium hydroxide (MILK OF MAGNESIA) suspension 30 mL  30 mL Oral Daily PRN Evanna Janann August, NP      . methocarbamol (  ROBAXIN) tablet 500 mg  500 mg Oral Q8H PRN Nehemiah Settle, MD      . multivitamin with minerals tablet 1 tablet  1 tablet Oral Daily Lavena Bullion, RD      . naproxen (NAPROSYN) tablet 500 mg  500 mg Oral BID PRN Nehemiah Settle, MD      . ondansetron (ZOFRAN-ODT) disintegrating tablet 4 mg  4 mg Oral Q6H PRN Nehemiah Settle, MD      . pregabalin (LYRICA) capsule 100 mg  100 mg Oral BID PRN Nehemiah Settle, MD      . topiramate (TOPAMAX) tablet 200 mg  200 mg Oral QHS Nehemiah Settle, MD   200 mg at 04/01/13 2128  . traZODone (DESYREL) tablet 50  mg  50 mg Oral QHS PRN,MR X 1 Evanna Cori Merry Proud, NP   50 mg at 03/31/13 0057  . venlafaxine XR (EFFEXOR-XR) 24 hr capsule 150 mg  150 mg Oral Q breakfast Nehemiah Settle, MD   150 mg at 04/02/13 1610    Lab Results: No results found for this or any previous visit (from the past 48 hour(s)).  Physical Findings: AIMS: Facial and Oral Movements Muscles of Facial Expression: None, normal Lips and Perioral Area: None, normal Jaw: None, normal Tongue: None, normal,Extremity Movements Upper (arms, wrists, hands, fingers): None, normal Lower (legs, knees, ankles, toes): None, normal, Trunk Movements Neck, shoulders, hips: None, normal, Overall Severity Severity of abnormal movements (highest score from questions above): None, normal Incapacitation due to abnormal movements: None, normal Patient's awareness of abnormal movements (rate only patient's report): No Awareness, Dental Status Current problems with teeth and/or dentures?: No Does patient usually wear dentures?: No  CIWA:  CIWA-Ar Total: 0 COWS:     Treatment Plan Summary: Daily contact with patient to assess and evaluate symptoms and progress in treatment Medication management  Plan:  Review of chart, vital signs, medications, and notes. 1. Continue crisis management and stabilization. 2. Medication management to reduce current symptoms to base line and improve patient's overall level of functioning 3. Treat health problems as indicated. 4. Develop treatment plan to decrease risk of relapse upon discharge and the need for     readmission. 5. Psycho-social education regarding relapse prevention and self care. 6. Health care follow up as needed for medical problems. 7. Continue home medications where appropriate. 8. Will plan on discharge out tomorrow AM.  Medical Decision Making Problem Points:  Established problem, stable/improving (1) and Review of psycho-social stressors (1) Data Points:  Review of medication  regiment & side effects (2)  I certify that inpatient services furnished can reasonably be expected to improve the patient's condition.  Rona Ravens. Caylah Plouff RPAC 2:54 PM 04/02/2013

## 2013-04-02 NOTE — Progress Notes (Signed)
Patient ID: Carla Gentry, female   DOB: 05-23-1981, 32 y.o.   MRN: [id number] Psychoeducational Group Note  Date:  04/02/2013 Time: 0900am  Group Topic/Focus:  Making Healthy Choices:   The focus of this group is to help patients identify negative/unhealthy choices they were using prior to admission and identify positive/healthier coping strategies to replace them upon discharge.  Participation Level:  Active  Participation Quality:  Appropriate  Affect:  Appropriate  Cognitive:  Appropriate  Insight:  Supportive  Engagement in Group:  Supportive  Additional Comments:  Inventory group   Valente David 04/02/2013,12:46 PM

## 2013-04-02 NOTE — [facility] Group Notes (Signed)
[facility] Group Notes:  (Clinical Social Work)  04/02/2013   3:00-4:00PM  Summary of Progress/Problems:   The main focus of today's process group was to   identify the patient's current support system and decide on other supports that can be put in place.  The picture on workbook was used to discuss why additional supports are needed, and a hand-out was distributed with four definitions/levels of support, then used to talk about how patients have given and received all different kinds of support.  An emphasis was placed on using counselor, doctor, therapy groups, 12-step groups, and problem-specific support groups to expand supports.  The patient identified one additional support as being her father who is really trying to be more supportive since her mother died.  Type of Therapy:  Process Group  Participation Level:  Active  Participation Quality:  Attentive  Affect:  Blunted and Depressed  Cognitive:  Oriented  Insight:  Engaged  Engagement in Therapy:  Engaged  Modes of Intervention:  Education,  Support and [employer], LCSW 04/02/2013, 1:00 PM

## 2013-04-03 MED ORDER — VENLAFAXINE HCL ER 150 MG PO CP24: 150.0000 mg | ORAL_CAPSULE | Freq: Every day | ORAL | Status: DC

## 2013-04-03 MED ORDER — TOPIRAMATE 100 MG PO TABS: 200.0000 mg | ORAL_TABLET | Freq: Every day | ORAL | Status: DC

## 2013-04-03 MED ORDER — TRAZODONE HCL 50 MG PO TABS: 50.0000 mg | ORAL_TABLET | Freq: Every evening | ORAL | Status: DC | PRN

## 2013-04-03 MED ORDER — LITHIUM CARBONATE ER 450 MG PO TBCR: 900.0000 mg | EXTENDED_RELEASE_TABLET | Freq: Every day | ORAL | Status: DC

## 2013-04-03 MED ORDER — PREGABALIN 150 MG PO CAPS: 150.0000 mg | ORAL_CAPSULE | Freq: Two times a day (BID) | ORAL | Status: DC

## 2013-04-03 NOTE — Progress Notes (Signed)
Recreation Therapy Notes  Date: 10.06.2014 Time: 3:00pm Location: [address] Dayroom  Group Topic: Wellness  Goal Area(s) Addresses:  Patient will verbalize benefit of whole wellness. Patient will identify at least two ways they are investing in each defined dimension of whole wellness.  Behavioral Response: Engaged, Attentive, Appropriate  Intervention: Air traffic controller  Activity: Wellness Flower. Patients were provided a worksheet with six dimensions of whole wellness - Wellness, Relationships, Physical Environment, Fun & Creativity, Personal Development, & Future. Patients were asked to identify two ways they are personally investing in each dimension.   Education: Discharge Planning.    Education Outcome: Acknowledges understanding   Clinical Observations/Feedback: Patient attended group session, completing worksheet as requested. Patient did not share items from her worksheet and made no contributions to group discussion, but appeared to actively listen as she maintained appropriate eye contact with speaker.   Marykay Lex Amala Petion, LRT/CTRS  Jearl Klinefelter 04/03/2013 3:48 PM

## 2013-04-03 NOTE — Progress Notes (Signed)
Adult Psychoeducational Group Note  Date:  04/03/2013 Time:  11:00am Group Topic/Focus:  Wellness Toolbox:   The focus of this group is to discuss various aspects of wellness, balancing those aspects and exploring ways to increase the ability to experience wellness.  Patients will create a wellness toolbox for use upon discharge.  Participation Level:  Active  Participation Quality:  Appropriate and Attentive  Affect:  Appropriate  Cognitive:  Alert and Appropriate  Insight: Appropriate  Engagement in Group:  Engaged  Modes of Intervention:  Discussion and Education  Additional Comments:  Pt attended and participated in group. When ask what does self care mean to her pt stated being able to take care of everything to function. Pt stated she rates her self care as a 7 because she puts her family first.  Pryor Curia 04/03/2013, 2:52 PM

## 2013-04-03 NOTE — [facility] Group Notes (Signed)
[facility] LCSW Aftercare Discharge Planning Group Note   04/03/2013 10:34 AM    Participation Quality:  Appropraite  Mood/Affect:  Appropriate  Depression Rating:  1  Anxiety Rating:  0  Thoughts of Suicide:  No  Will you contract for safety?   NA  Current AVH:  No  Plan for Discharge/Comments:  Patient attending discharge planning group and actively participated in group.  She reports doing well and being ready to discharge home today.  She will follow up with [facility] for outpatient services.CSW provided all participants with daily workbook.   Transportation Means: Patient has transportation.   Supports:  Patient has a support system.   Carla Gentry, Joesph July

## 2013-04-03 NOTE — Progress Notes (Signed)
D:  Patient's self inventory sheet, patient has fair sleep, good appetite, normal energy level, good attention span.  Rated depression and hopelessness #1.  Denied withdrawals.  Denied SI.  Denied physical problems.  Worst pain #1.  After discharge, plans to keep taking meds, use support system, start therapy.  "Depression and withdrawals symptoms have resolved and I would love to be discharged today."   Does have discharge plans.  No problems taking meds after discharge. A:  Medications administered per MD orders.  Emotional support and encouragement given patient. R:  Denied SI and HI.  Denied A/V hallucinations.  Will continue to monitor patient for safety with 15 minute checks.  Safety maintained.

## 2013-04-03 NOTE — Tx Team (Signed)
Interdisciplinary Treatment Plan Update   Date Reviewed:  04/03/2013  Time Reviewed:  9:44 AM  Progress in Treatment:   Attending groups: Yes Participating in groups: Yes Taking medication as prescribed: Yes  Tolerating medication: Yes Family/Significant other contact made: Yes  Patient understands diagnosis: Yes, contact made with husband  Discussing patient identified problems/goals with staff: Yes Medical problems stabilized or resolved: Yes Denies suicidal/homicidal ideation: Yes Patient has not harmed self or others: Yes  For review of initial/current patient goals, please see plan of care.  Estimated Length of Stay:  Discharge today  Reasons for Continued Hospitalization:   New Problems/Goals identified:    Discharge Plan or Barriers:   Home with outpatient follow up [facility]  Additional Comments: N/A  Attendees:  Patient:  Carla Gentry 04/03/2013 9:44 AM   Signature: Mervyn Gay, MD 04/03/2013 9:44 AM  Signature:  Dara Hoyer, PA 04/03/2013 9:44 AM  Signature:  RN 04/03/2013 9:44 AM  Signature:  04/03/2013 9:44 AM  Signature:   04/03/2013 9:44 AM  Signature:  Juline Patch, LCSW 04/03/2013 9:44 AM  Signature:  Reyes Ivan, LCSW 04/03/2013 9:44 AM  Signature:  Maseta Dorley,Care Coordinator 04/03/2013 9:44 AM  Signature: 04/03/2013 9:44 AM  Signature: 04/03/2013  9:44 AM  Signature:    Signature:      Scribe for Treatment Team:   Juline Patch,  04/03/2013 9:44 AM

## 2013-04-03 NOTE — Progress Notes (Signed)
Adult Psychoeducational Group Note  Date:  04/02/13 Time:  8:00 pm  Group Topic/Focus:  Wrap-Up Group:   The focus of this group is to help patients review their daily goal of treatment and discuss progress on daily workbooks.  Participation Level:  Active  Participation Quality:  Appropriate  Affect:  Appropriate  Cognitive:  Appropriate  Insight: Appropriate  Engagement in Group:  Engaged  Modes of Intervention:  Discussion, Education, Socialization and Support  Additional Comments:  Pt stated that she is in the hospital for her depression. Pt identified reading fictional books and being honest with her support systems about her true feelings as coping skills to practice when released from the [facility].   Laverne Klugh 04/03/2013, 12:21 AM

## 2013-04-03 NOTE — [facility] Group Notes (Signed)
[facility] LCSW Group Therapy          Overcoming Obstacles       1:15 -2:30        04/03/2013   3:37 PM     Type of Therapy:  Group Therapy  Participation Level:  Appropriate  Participation Quality:  Appropriate  Affect:  Appropriate, Alert  Cognitive:  Attentive Appropriate  Insight: Developing/Improving Engaged  Engagement in Therapy: Developing/Imprvoing Engaged  Modes of Intervention:  Discussion Exploration  Education Rapport BuildingProblem-Solving Support  Summary of Progress/Problems:  The main focus of today's group was overcoming  Obstacles.  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  Patient shared she is open to any job but when employers view her resume and see she has only worked as a Charity fundraiser, it makes it difficult to be considered for other positions.   Wynn Banker 04/03/2013    3:37 PM

## 2013-04-03 NOTE — [facility] Suicide Risk Assessment (Signed)
Suicide Risk Assessment  Discharge Assessment     Demographic Factors:  Adolescent or young adult, Caucasian and Low socioeconomic status  Mental Status Per Nursing Assessment::   On Admission:  Suicidal ideation indicated by patient  Current Mental Status by Physician: Mental Status Examination: Patient appeared as per his stated age, casually dressed, and fairly groomed, and maintaining good eye contact. Patient has good mood and his affect was constricted. He has normal rate, rhythm, and volume of speech. His thought process is linear and goal directed. Patient has denied suicidal, homicidal ideations, intentions or plans. Patient has no evidence of auditory or visual hallucinations, delusions, and paranoia. Patient has fair insight judgment and impulse control.  Loss Factors: Decline in physical health and Financial problems/change in socioeconomic status  Historical Factors: Prior suicide attempts and Impulsivity  Risk Reduction Factors:   Sense of responsibility to family, Religious beliefs about death, Living with another person, especially a relative, Positive social support, Positive therapeutic relationship and Positive coping skills or problem solving skills  Continued Clinical Symptoms:  Bipolar Disorder:   Mixed State Alcohol/Substance Abuse/Dependencies Previous Psychiatric Diagnoses and Treatments Medical Diagnoses and Treatments/Surgeries  Cognitive Features That Contribute To Risk:  Polarized thinking    Suicide Risk:  Minimal: No identifiable suicidal ideation.  Patients presenting with no risk factors but with morbid ruminations; may be classified as minimal risk based on the severity of the depressive symptoms  Discharge Diagnoses:   AXIS I:  Bipolar, Depressed, Substance Abuse and Substance Induced Mood Disorder AXIS II:  Deferred AXIS III:   Past Medical History  Diagnosis Date  . Depression   . Migraine   . Anxiety    AXIS IV:  other psychosocial or  environmental problems and problems related to social environment AXIS V:  61-70 mild symptoms  Plan Of Care/Follow-up recommendations:  Activity:  As tolerated Diet:  Regular  Is patient on multiple antipsychotic therapies at discharge:  No   Has Patient had three or more failed trials of antipsychotic monotherapy by history:  No  Recommended Plan for Multiple Antipsychotic Therapies: NA  Carla Gentry,Carla R. 04/03/2013, 2:41 PM

## 2013-04-03 NOTE — Progress Notes (Signed)
[facility] Adult Case Management Discharge Plan :  Will you be returning to the same living situation after discharge: Yes,  Patient returning to her home. At discharge, do you have transportation home?:Yes,  Patient has transportation. Do you have the ability to pay for your medications:Yes,  Patient is able to obtain medications.  Release of information consent forms completed and in the chart;  Patient's signature needed at discharge.  Patient to Follow up at: Follow-up Information   Follow up with Anne Fu, [facility] On 04/06/2013. (Thursday, April 06, 2013 at 4:40 PM)    Contact information:   [address] [place], [place]   [address]  [phone]      Patient denies SI/HI:   Patient will no longer endorse SI/other thought self harm     Safety Planning and Suicide Prevention discussed:  .Reviewed with all patients during discharge planning group   Tamika Shropshire, Joesph July 04/03/2013, 10:23 AM

## 2013-04-03 NOTE — Progress Notes (Signed)
Patient ID: Carla Gentry, female   DOB: 1980-12-31, 32 y.o.   MRN: [id number] D)   Pt seems brighter this evening, smiling, conversational, states is feeling better and is looking forward to getting her life back to normal.  Talked about how difficult it had been to try to work with constant migraines and pain issues, thankful to finally be off pain meds.  States her husband was in to visit, is supportive.  Talking about going back to work at some point,  Denies thoughts of self harm.  Attended group, compliant with meds, pleasant, cooperative, establishing some attainable goals for the future. A)  Will continue to monitor for safety, continue POC R)  Safety maintained.

## 2013-04-03 NOTE — Progress Notes (Signed)
Discharge Note:  Patient discharged home.  Denied SI and HI.   Denied A/V hallucinations.  Denied pain.  Suicide prevention information given and discussed with patient.  Patient stated she received all her belongings.  Patient stated she appreciated all assistance received from [facility] staff.

## 2013-04-03 NOTE — Discharge Summary (Signed)
Physician Discharge Summary Note  Patient:  Carla Gentry is an 32 y.o., female MRN:  [id number] DOB:  [birth year] Patient phone:  [phone] (home)  Patient address:   [address] [place] [address],   Date of Admission:  03/29/2013 Date of Discharge: 04/03/2013   Reason for Admission:  Bipolar disorder  Discharge Diagnoses: Principal Problem:   Suicidal ideation Active Problems:   Opioid use with withdrawal   Bipolar I disorder, most recent episode (or current) depressed, severe, without mention of psychotic behavior  ROS  DSM5: DSM5:  Schizophrenia Disorders:  Obsessive-Compulsive Disorders:  Trauma-Stressor Disorders:  Substance/Addictive Disorders:  Depressive Disorders:  AXIS I: Bipolar, Depressed and Opioid withdrawal  AXIS II: Deferred  AXIS III:  Past Medical History   Diagnosis  Date   .  Depression    .  Migraine    .  Anxiety     AXIS IV: economic problems, occupational problems, other psychosocial or environmental problems and problems related to social environment  AXIS V: 41-50 serious symptoms  Level of Care:  OP  Hospital Course:       Carla Gentry was admitted voluntarily after she presented reporting suicidal ideations with plans to overdose on her medication. She has a history of bipolar disorder and chronic cephalgia for which she had surgery to severe the nerve endings in the back of her head. She has used opiates for several years and has recently detoxed off of them and is now currently manic requiring admission for stabilization and crisis management.      Carla Gentry was admitted to the adult unit where she was evaluated and her symptoms were identified. Medication management was discussed and implemented. She was encouraged to participate in unit programming. Medical problems were identified and treated appropriately. Home medication was restarted as needed.                     Carla Gentry was evaluated each day by a clinical provider to  ascertain the patient's response to treatment.  Improvement was noted by the patient's report of decreasing symptoms, improved sleep and appetite, affect, medication tolerance, behavior, and participation in unit programming.  Carla Gentry was asked each day to complete a self inventory noting mood, mental status, pain, new symptoms, anxiety and concerns.       She responded well to medication and being in a therapeutic and supportive environment. Positive and appropriate behavior was noted and the patient was motivated for recovery.  Carla Gentry worked closely to develop a discharge plan with appropriate goals. Coping skills, problem solving as well as relaxation therapies were also part of the unit programming.         By the day of discharge Carla Gentry was in much improved condition than upon admission.  Symptoms were reported as significantly decreased or resolved completely. The patient denied SI/HI and voiced no AVH. She was motivated to continue taking medication with a goal of continued improvement in mental health.          Carla Gentry was discharged home with a plan to follow up as noted below.   Consults:  None  Significant Diagnostic Studies:  labs: UA, UDS, CMP, CBC  Discharge Vitals:   Blood pressure 120/89, pulse 102, temperature 98.3 F (36.8 C), temperature source Oral, resp. rate 24, height 4\' 11"  (1.499 m), weight 53.978 kg (119 lb). Body mass index is 24.02 kg/(m^2). Lab Results:   No results found for this or any previous visit (from the past 72  hour(s)).  Physical Findings: AIMS: Facial and Oral Movements Muscles of Facial Expression: None, normal Lips and Perioral Area: None, normal Jaw: None, normal Tongue: None, normal,Extremity Movements Upper (arms, wrists, hands, fingers): None, normal Lower (legs, knees, ankles, toes): None, normal, Trunk Movements Neck, shoulders, hips: None, normal, Overall Severity Severity of abnormal movements (highest score from questions above):  None, normal Incapacitation due to abnormal movements: None, normal Patient's awareness of abnormal movements (rate only patient's report): No Awareness, Dental Status Current problems with teeth and/or dentures?: No Does patient usually wear dentures?: No  CIWA:  CIWA-Ar Total: 0 COWS:     Psychiatric Specialty Exam: See Psychiatric Specialty Exam and Suicide Risk Assessment completed by Attending Physician prior to discharge.  Discharge destination:  Home  Is patient on multiple antipsychotic therapies at discharge:  No   Has Patient had three or more failed trials of antipsychotic monotherapy by history:  No  Recommended Plan for Multiple Antipsychotic Therapies: NA  Discharge Orders   Future Orders Complete By Expires   Diet - low sodium heart healthy  As directed    Discharge instructions  As directed    Comments:     Take all of your medications as directed. Be sure to keep all of your follow up appointments.  If you are unable to keep your follow up appointment, call your Doctor's office to let them know, and reschedule.  Make sure that you have enough medication to last until your appointment. Be sure to get plenty of rest. Going to bed at the same time each night will help. Try to avoid sleeping during the day.  Increase your activity as tolerated. Regular exercise will help you to sleep better and improve your mental health. Eating a heart healthy diet is recommended. Try to avoid salty or fried foods. Be sure to avoid all alcohol and illegal drugs.   Increase activity slowly  As directed        Medication List    STOP taking these medications       HYDROcodone-acetaminophen 10-325 MG per tablet  Commonly known as:  NORCO     ibuprofen 200 MG tablet  Commonly known as:  ADVIL,MOTRIN     lithium carbonate 300 MG capsule  Replaced by:  lithium carbonate 450 MG CR tablet      TAKE these medications     Indication   eletriptan 20 MG tablet  Commonly known as:   RELPAX  Take 20 mg by mouth as needed for migraine. One tablet by mouth at onset of headache. May repeat in 2 hours if headache persists or recurs.      lithium carbonate 450 MG CR tablet  Commonly known as:  ESKALITH  Take 2 tablets (900 mg total) by mouth at bedtime. For mood stabilization and bipolar mania.   Indication:  Manic-Depression     pregabalin 150 MG capsule  Commonly known as:  LYRICA  Take 1-2 capsules (150-300 mg total) by mouth 2 (two) times daily. 1 cap in am, 2 caps in pm for migraine headache prophylaxis.   Indication:  Migraine Headache     topiramate 100 MG tablet  Commonly known as:  TOPAMAX  Take 2 tablets (200 mg total) by mouth at bedtime. For mood stabilization and migraine prophylaxis.   Indication:  Migraine Headache     traZODone 50 MG tablet  Commonly known as:  DESYREL  Take 1 tablet (50 mg total) by mouth at bedtime as needed and may repeat  dose one time if needed for sleep.   Indication:  Trouble Sleeping     venlafaxine XR 150 MG 24 hr capsule  Commonly known as:  EFFEXOR-XR  Take 1 capsule (150 mg total) by mouth daily with breakfast. For depression and anxiety.   Indication:  Generalized Anxiety Disorder, Major Depressive Disorder           Follow-up Information   Follow up with Anne Fu, [facility] On 04/06/2013. (Thursday, April 06, 2013 at 4:40 PM)    Contact information:   [address] [place], [place]   [address]  [phone]      Follow-up recommendations:   Activities: Resume activity as tolerated. Diet: Heart healthy low sodium diet Tests: Follow up testing will be determined by your out patient provider. Comments:    Total Discharge Time:  Less than 30 minutes.  Signed: Rona Ravens. [place] RPAC 12:02 PM 04/03/2013  Patient was seen for psychiatric evaluation, suicidal risk assessment, and developed treatment plan. Case discussed with treatment team and reviewed the information documented and agree with  the treatment plan.   Negar Sieler,JANARDHAHA R. 04/11/2013 12:40 PM

## 2013-04-04 NOTE — Progress Notes (Signed)
Pt attended spiritual care group on grief and loss facilitated by chaplain Burnis Kingfisher.   Group opened with brief discussion and psycho-social ed around grief and loss in relationships and in relation to self - identifying life patterns, circumstances, changes that cause losses. Established group norm of speaking from own life experience. Pt's then engaged one another around significant losses in their lives, exploring how these losses affected them, what norms and themes they can identify around grief, and providing support for one another.   Carla Gentry described the recent loss of her mother and spoke with group about grief around her 68 y/o daughter not having opportunity to grow up with grandmother.  Carla Gentry is supported by her father and spoke about ways that she works to ensure her daughter knows gets to know who daughter's grandmother is.     Belva Crome MDiv

## 2013-04-06 NOTE — Progress Notes (Signed)
Patient Discharge Instructions:  After Visit Summary (AVS):   Faxed to:  04/06/13 Psychiatric Admission Assessment Note:   Faxed to:  04/06/13 Suicide Risk Assessment - Discharge Assessment:   Faxed to:  04/06/13 Faxed/Sent to the Next Level Care provider:  04/06/13 Faxed to [facility] @ [phone]  Jerelene Redden, 04/06/2013, 2:32 PM

## 2013-04-26 ENCOUNTER — Observation Stay: Payer: Self-pay | Admitting: Internal Medicine

## 2013-04-26 LAB — URINALYSIS, COMPLETE
Glucose,UR: NEGATIVE mg/dL (ref 0–75)
Ph: 5 (ref 4.5–8.0)
Protein: 30
Specific Gravity: 1.018 (ref 1.003–1.030)
Squamous Epithelial: 28
WBC UR: 422 /HPF (ref 0–5)

## 2013-04-26 LAB — COMPREHENSIVE METABOLIC PANEL
Albumin: 3 g/dL — ABNORMAL LOW (ref 3.4–5.0)
Alkaline Phosphatase: 155 U/L — ABNORMAL HIGH (ref 50–136)
Co2: 23 mmol/L (ref 21–32)
Creatinine: 0.7 mg/dL (ref 0.60–1.30)
Glucose: 95 mg/dL (ref 65–99)
Osmolality: 268 (ref 275–301)
Potassium: 3 mmol/L — ABNORMAL LOW (ref 3.5–5.1)
SGOT(AST): 36 U/L (ref 15–37)
SGPT (ALT): 48 U/L (ref 12–78)
Total Protein: 7.2 g/dL (ref 6.4–8.2)

## 2013-04-26 LAB — CBC
HGB: 11.4 g/dL — ABNORMAL LOW (ref 12.0–16.0)
MCH: 26.2 pg (ref 26.0–34.0)
MCV: 80 fL (ref 80–100)

## 2013-04-26 LAB — DIFFERENTIAL
Basophil #: 0 10*3/uL (ref 0.0–0.1)
Eosinophil #: 0.1 10*3/uL (ref 0.0–0.7)
Lymphocyte %: 15.4 %
Monocyte #: 1.2 x10 3/mm — ABNORMAL HIGH (ref 0.2–0.9)
Neutrophil #: 5.4 10*3/uL (ref 1.4–6.5)
Neutrophil %: 67.8 %

## 2013-04-26 LAB — LIPASE, BLOOD: Lipase: 117 U/L (ref 73–393)

## 2013-04-28 LAB — URINE CULTURE

## 2013-11-01 IMAGING — US ABDOMEN ULTRASOUND LIMITED
1 series · 14 of 25 positions shown · non-contrast
Comparison: none

REASON FOR EXAM: severe ruq pain
COMMENTS:   Body Site: Right Upper Quad

[Series 1: abdomen ultrasound limited · 0.21mm/px · 14 of 45 slices shown]
[im 1/45]
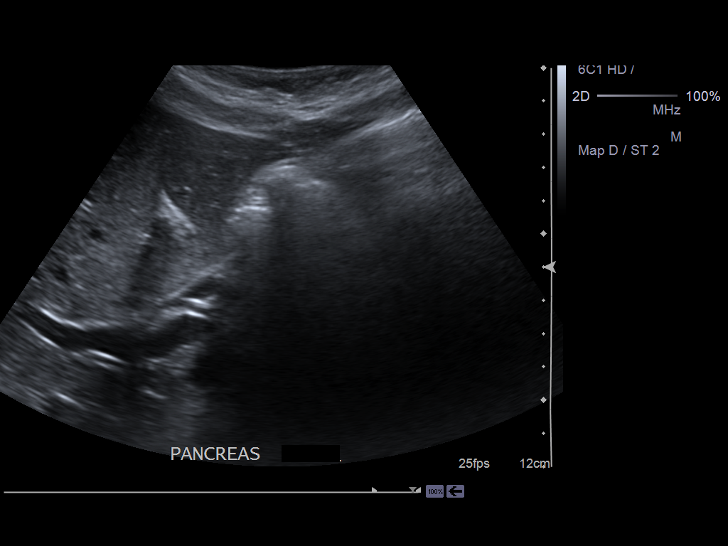
[im 4/45]
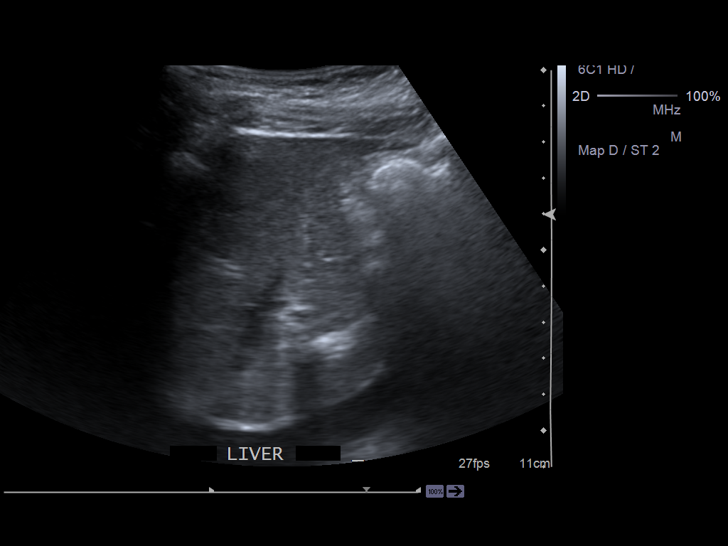
[im 8/45]
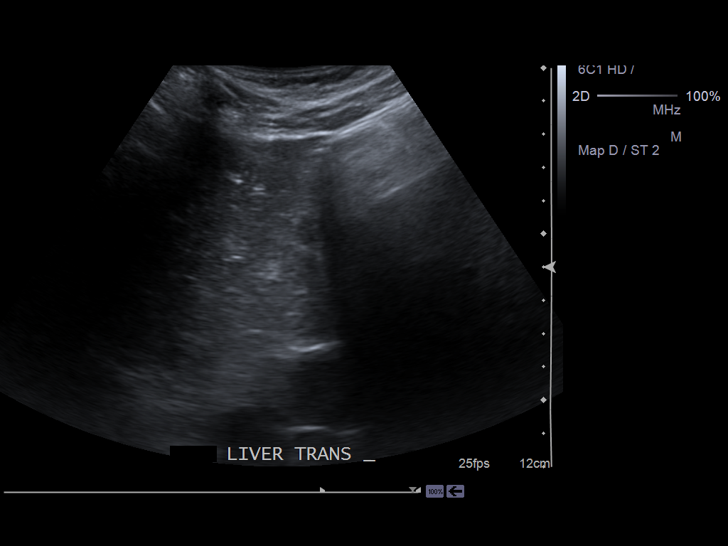
[im 12/45]
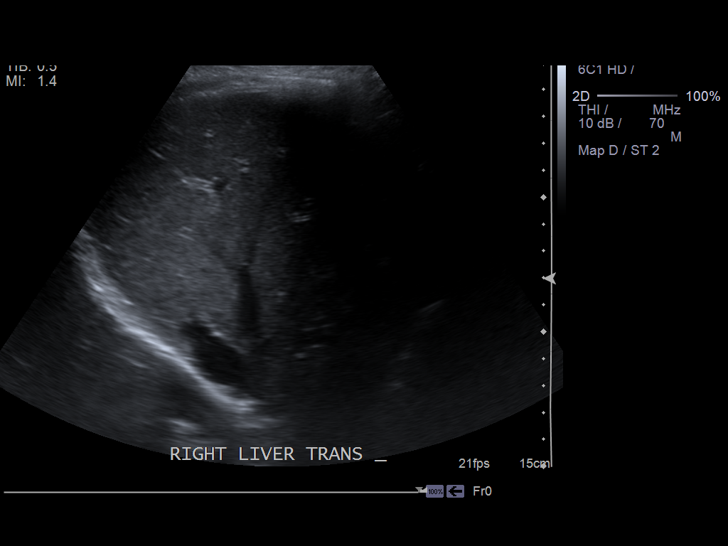
[im 15/45]
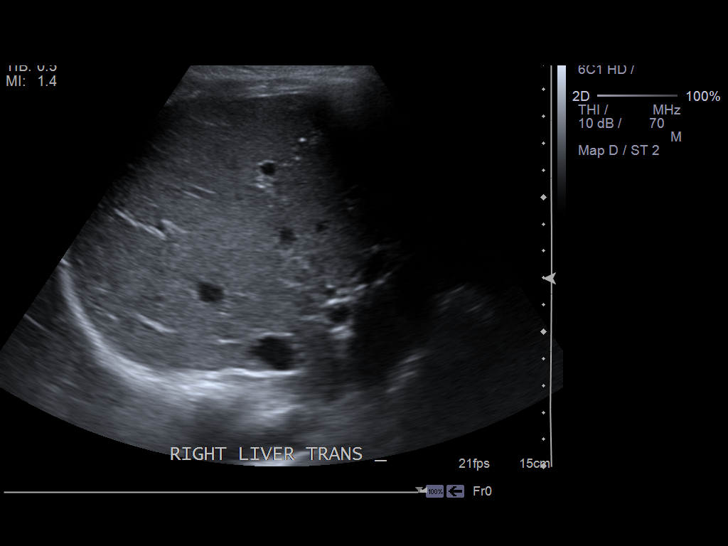
[im 17/45]
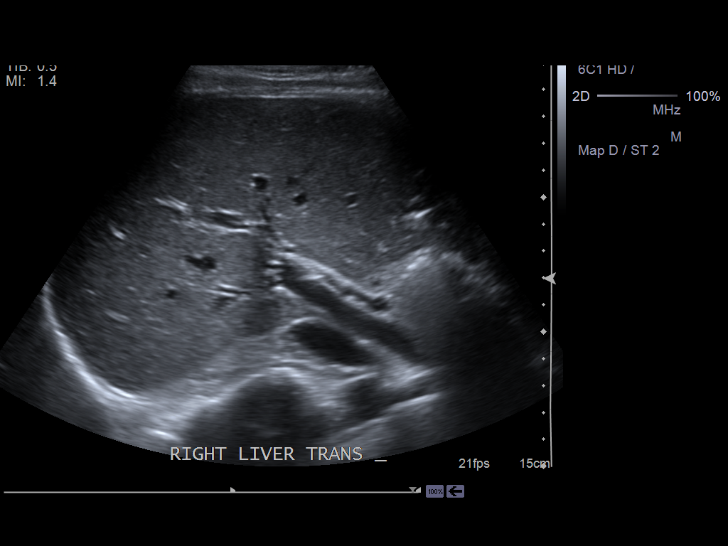
[im 21/45]
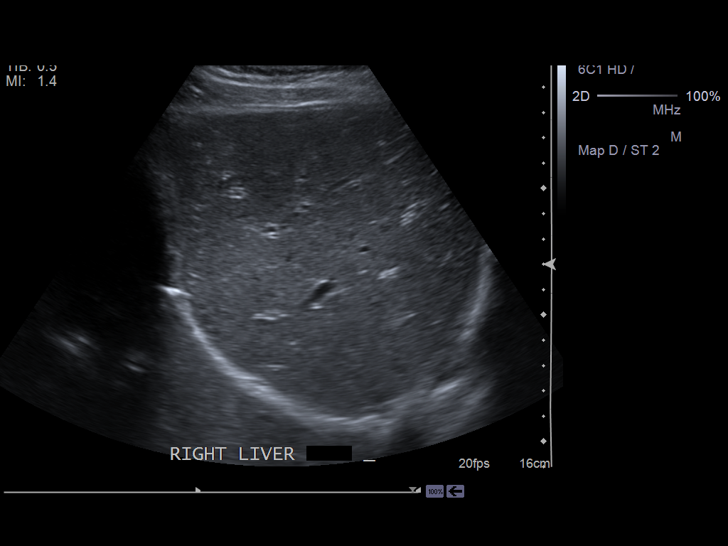
[im 24/45]
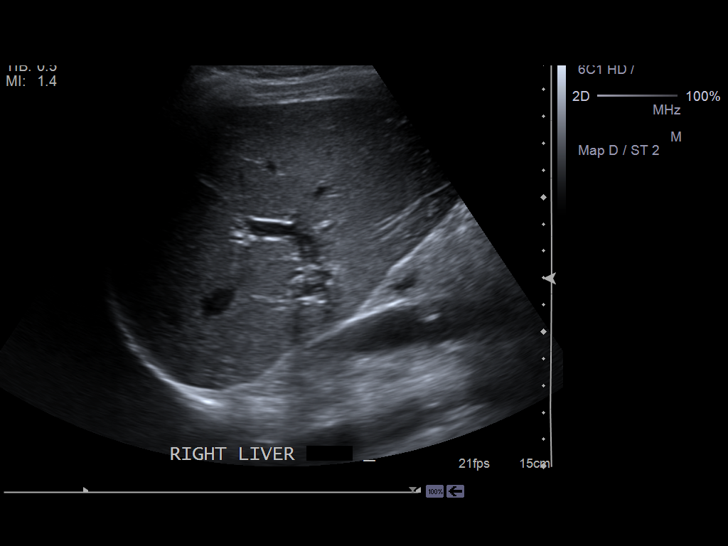
[im 28/45]
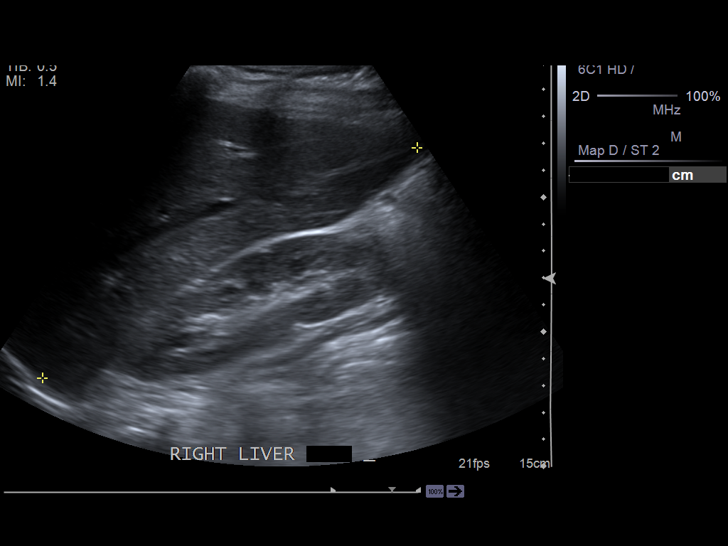
[im 30/45]
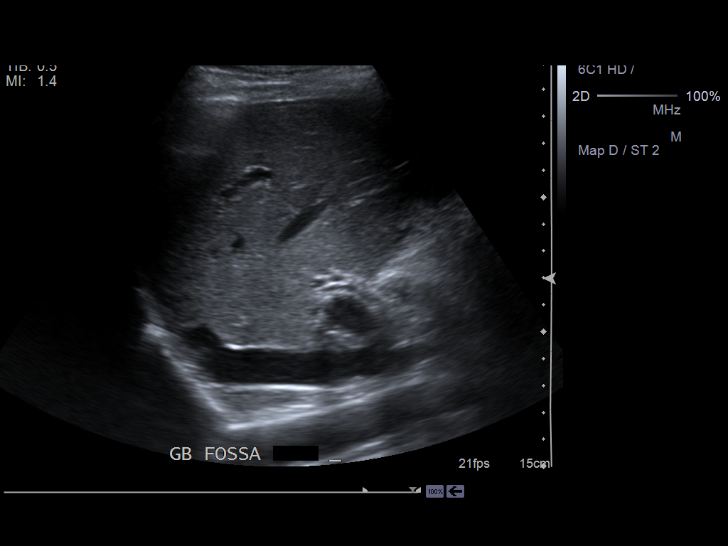
[im 34/45]
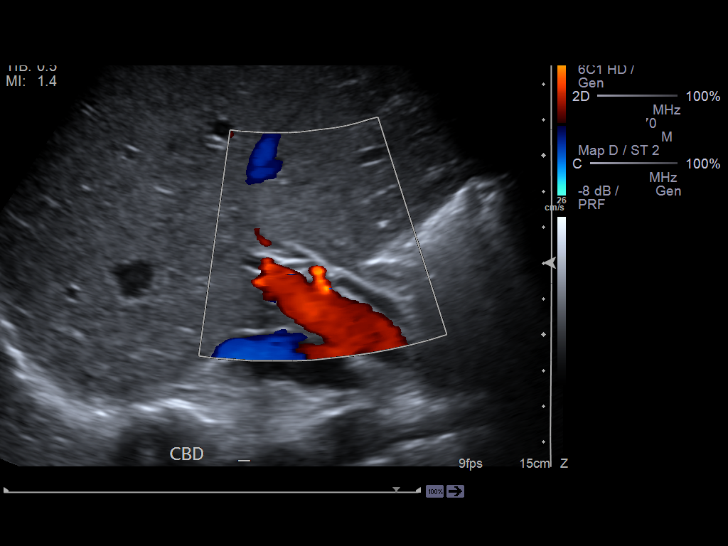
[im 37/45]
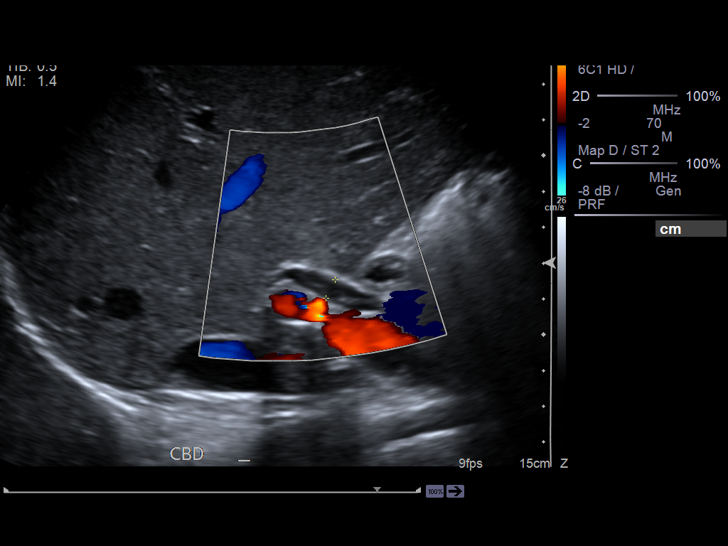
[im 41/45]
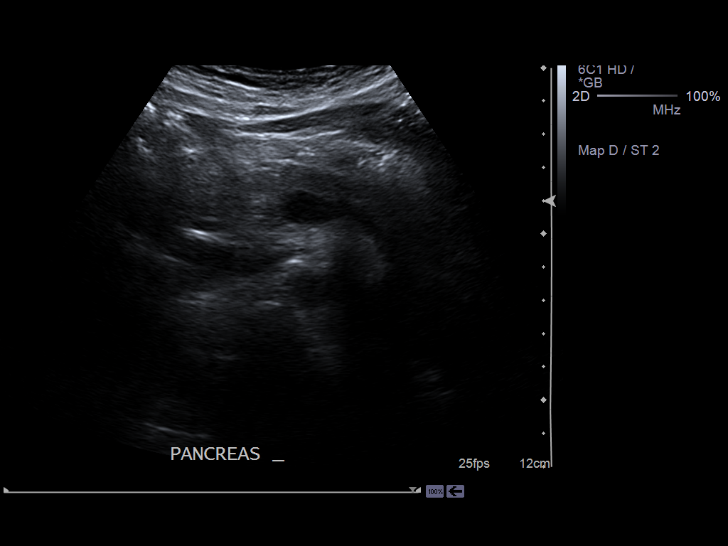
[im 45/45]
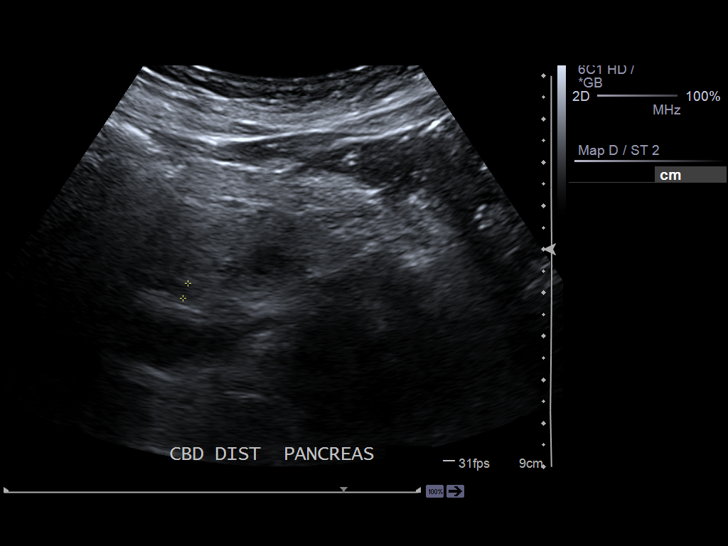

[14 of 25 positions shown; findings below may reference images not displayed]

PROCEDURE:     US  - US ABDOMEN LIMITED SURVEY  - April 26, 2013 [DATE]

RESULT:     Limited right upper quadrant abdominal ultrasound is performed.
There is a history of cholecystectomy in 5282. There is poor visualization
of the pancreas because of overlying bowel gas. The hepatic veins and
proximal inferior vena cava appear patent. The liver length is 16.34 cm. The
hepatic echotexture is normal. The common bile duct diameter measures up to
5.8 mm. Distally the measurement tapers to 3.6 mm diameter. There is no
ascites. Mild intrahepatic biliary ductal dilation is not excluded and is
suggested on the images.
IMPRESSION: There is mild biliary ductal prominence although the distal
common duct is only 3.6 mm near the pancreatic head. Correlate with LFTs.
Mild biliary ductal prominence is not atypical in a post cholecystectomy
patient.

[REDACTED]

## 2013-11-01 IMAGING — CT CT ABD-PELV W/ CM
1 of 2 series · 15 of 32 positions shown, 19 images · non-contrast
Comparison: none

REASON FOR EXAM: (1) ruq pain; (2) ruq paib
COMMENTS:

PROCEDURE:     CT  - CT ABDOMEN / PELVIS  W  - April 26, 2013  [DATE]
RESULT:     CT abdomen and pelvis dated 04/26/2013
TECHNIQUE: Helical 3 mm sections were obtained from the lung bases through
the pubic symphysis status post intravenous administration of 85 mL of
[id number].

[Series 2: 3mm soft tissue · axial · 0.68mm/px · z∈[+292,+718]mm · 15 of 156 slices shown, 19 images]
[im 7/156  soft-tissue]
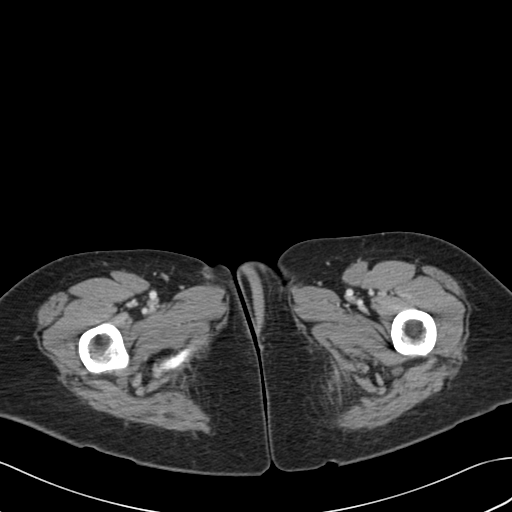
[im 7/156  bone]
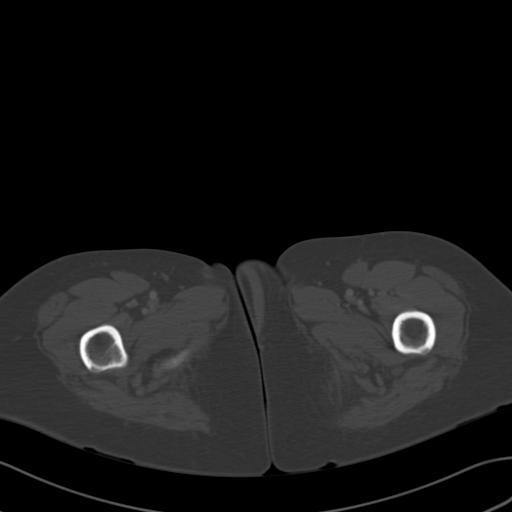
[im 20/156  soft-tissue]
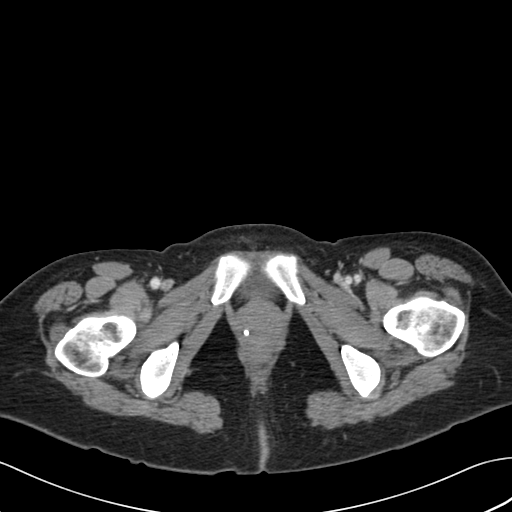
[im 33/156  soft-tissue]
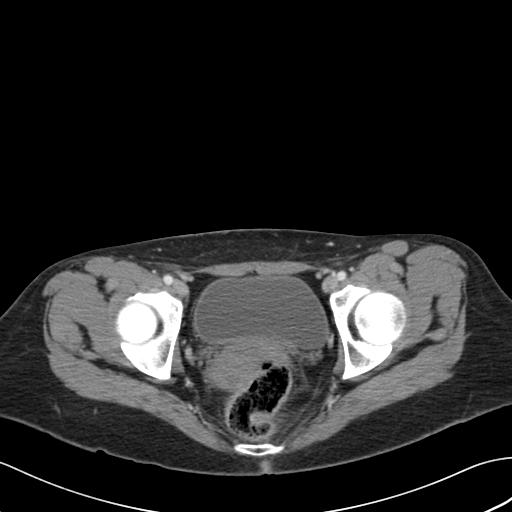
[im 46/156  soft-tissue]
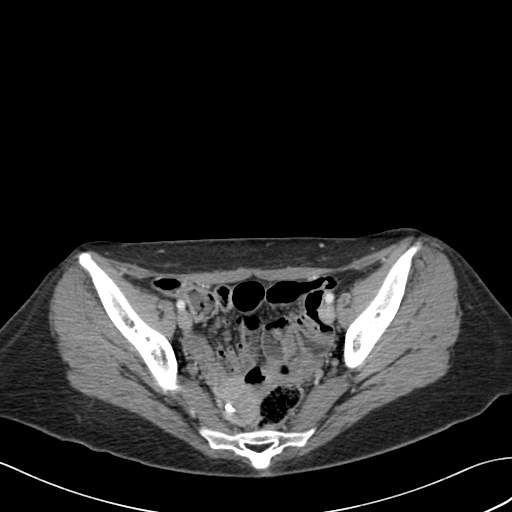
[im 52/156  soft-tissue]
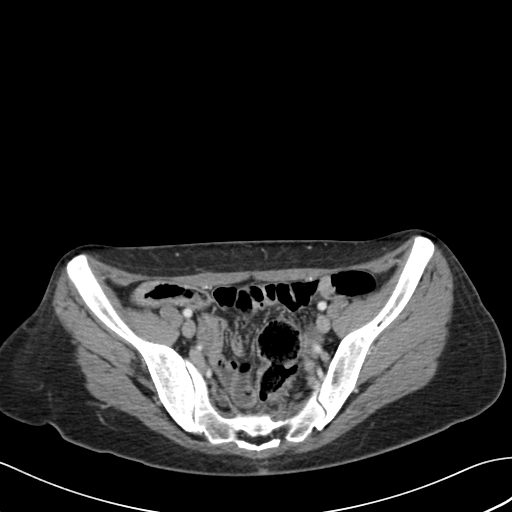
[im 65/156  soft-tissue]
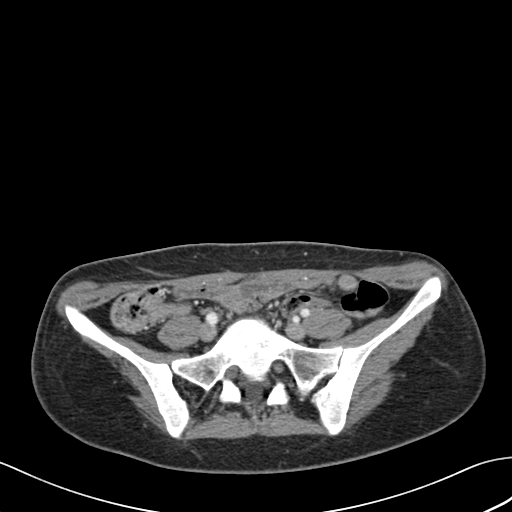
[im 78/156  soft-tissue]
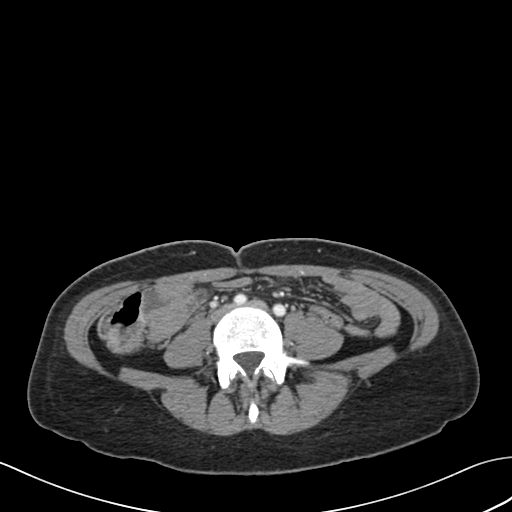
[im 91/156  soft-tissue]
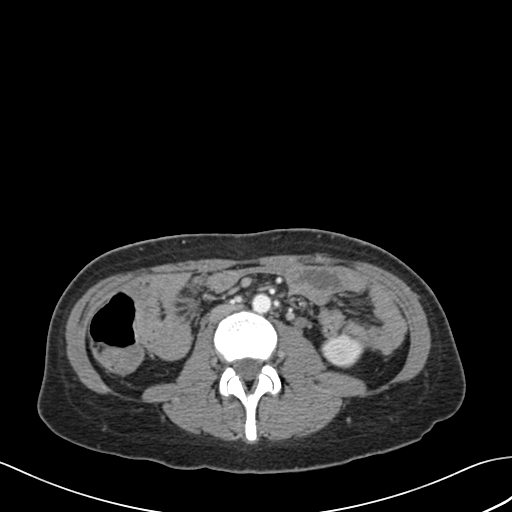
[im 104/156  soft-tissue]
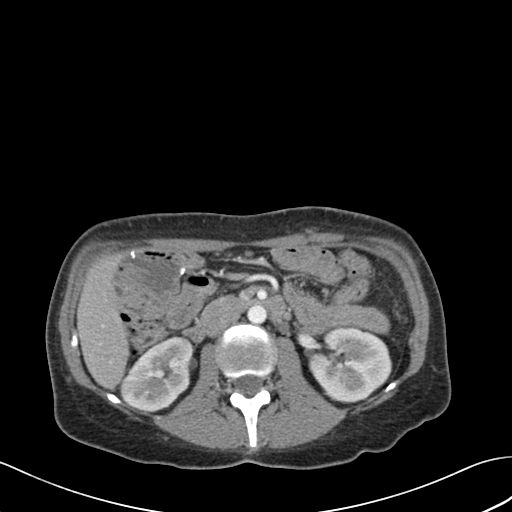
[im 104/156  bone]
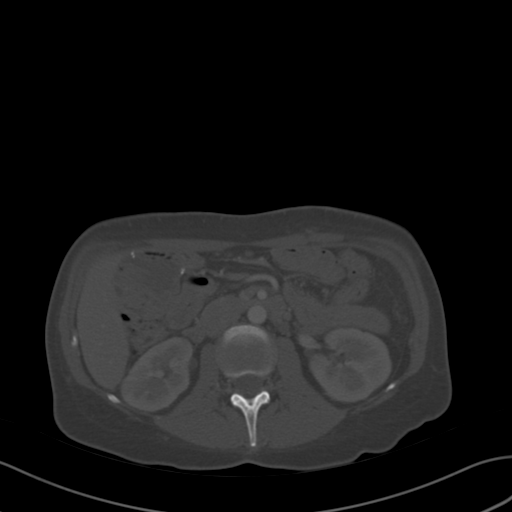
[im 110/156  soft-tissue]
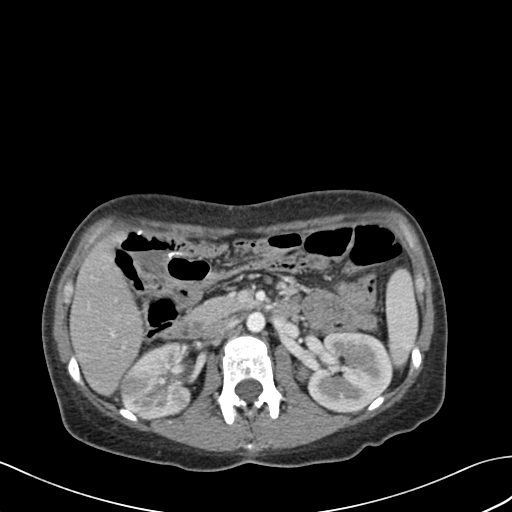
[im 123/156  soft-tissue]
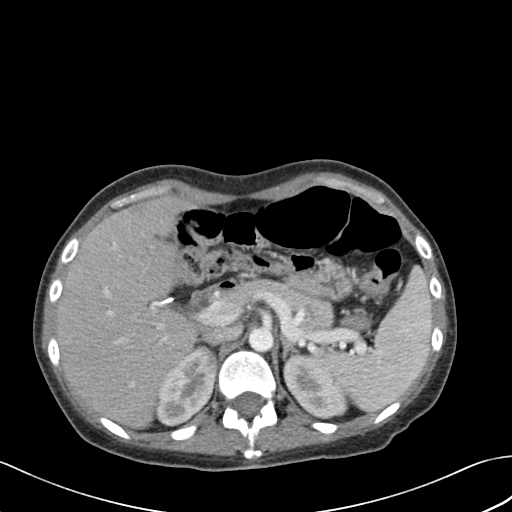
[im 130/156  lung]
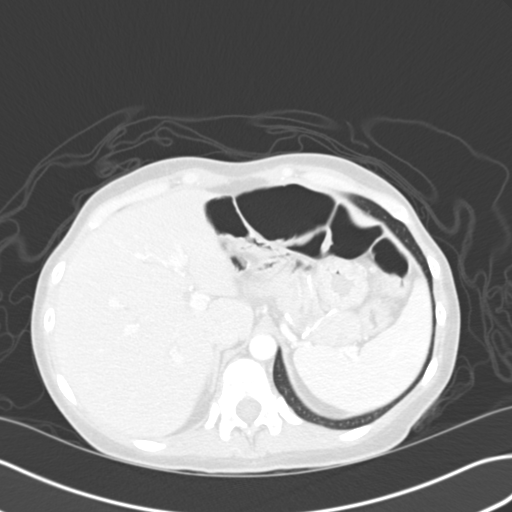
[im 136/156  soft-tissue]
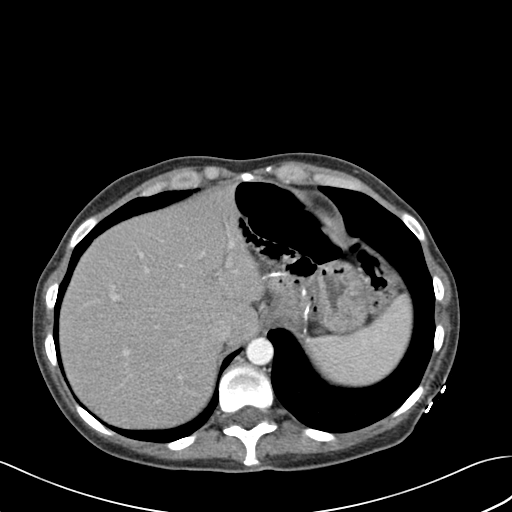
[im 136/156  lung]
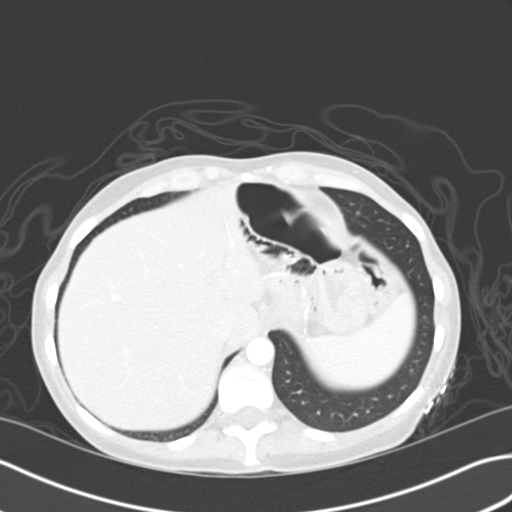
[im 143/156  lung]
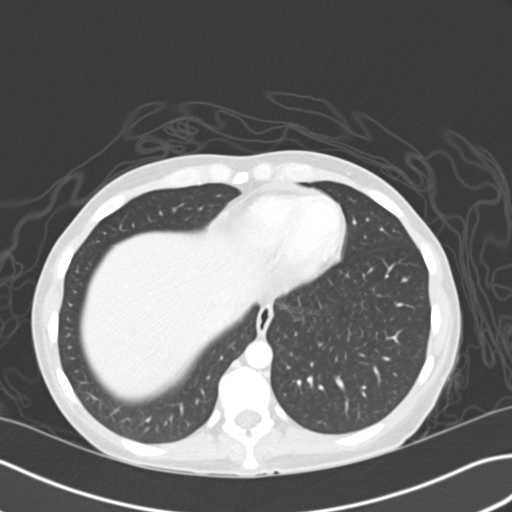
[im 149/156  soft-tissue]
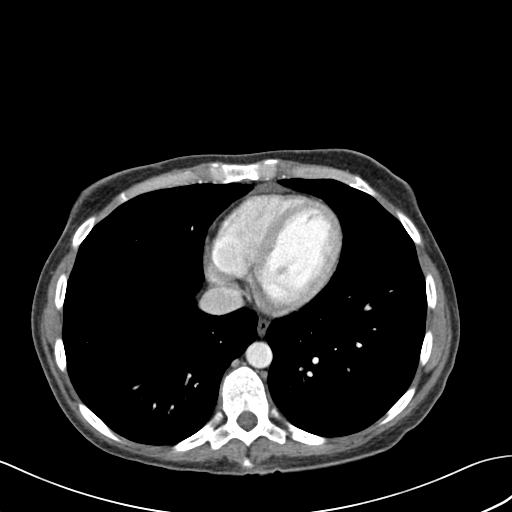
[im 149/156  lung]
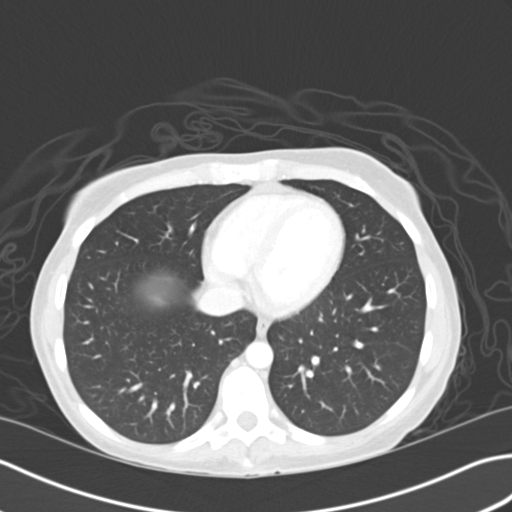

[15 of 32 positions shown; findings below may reference images not displayed]

FINDINGS: The lung bases are unremarkable.

Evaluation liver demonstrates a low attenuating rounded nodule in the
posterior dome the right lobe of the liver measuring 1.26 cm demonstrate
calcite units of -57 P this has the appearance of a small lipoma. An
ill-defined area of low attenuation objects the anterior base of the left
lower liver like ribs and a focal fatty infiltration. The liver is otherwise
unremarkable. Patient status post cholecystectomy. Postsurgical changes
identified within loops of bowel within the hepatic flexure. The kidneys,
spleen, adrenals, pancreas are unremarkable. There findings consistent with
prior gastric bypass. No evidence of bowel obstruction. No evidence of
abdominal aortic aneurysm. An intrauterine device is appreciated within the
pelvis. No evidence of masses, free fluid nor loculated fluid collections.
The celiac, SMA, IMA couple portal vein are opacified.
IMPRESSION: No CT evidence of obstructive or inflammatory abnormalities.

## 2014-10-19 NOTE — H&P (Signed)
PATIENT NAME:  Carla Gentry, Carla Gentry MR#:  [id number] DATE OF BIRTH:  1980-07-20  DATE OF ADMISSION:  04/26/2013  REFERRING ER PHYSICIAN:   Dorothea GlassmanPaul Malinda, MD  CHIEF COMPLAINT: Nausea and abdominal pain.   HISTORY OF PRESENT ILLNESS: The patient is a 34 year old female with past history of migraine and depression, who had some surgery on her head for her occipital, now causing her repeated headaches that was done 2 months ago. Now for a few weeks she has complained of nausea which is on and off but for the last 2 to 3 days she started having abdominal pain which is in the right upper side and also going to the back and also started feeling night sweats and so decided to come to the Emergency Room. On initial workup, her UA was found being positive and also her CT scan of the abdomen and sonogram of right upper quadrant done in the ER. They were noncontributory for any pyelonephritis or any stones. She was given admission because of intractable nausea and pain, for further management.   REVIEW OF SYSTEMS: CONSTITUTIONAL: Negative for fever, fatigue, weakness, but is positive for pain in the abdomen.  EYES:  No blurring, double vision, pain or redness.  EARS, NOSE, THROAT: No tinnitus, ear pain or hearing loss.  RESPIRATORY: No cough, wheezing, hemoptysis or shortness of breath.  CARDIOVASCULAR: No chest pain, orthopnea, edema or arrhythmia.  GASTROINTESTINAL: The patient has some nausea but no vomiting, diarrhea. Had abdominal pain on the right upper quadrant going to the back.  GENITOURINARY: The patient does not have any dysuria or hematuria but she noticed some change in her smell of the urine and she has been drinking less amount of water for the last few days so she is not seeing any increased frequency in her urination.  ENDOCRINE: No increased sweating but had excessive night sweats. No heat or cold intolerance.  SKIN: No acne, rashes, or lesions on the skin.  MUSCULOSKELETAL: No pain or swelling  in the joints.  NEUROLOGICAL: No numbness, weakness, tremors or vertigo.  PSYCHIATRIC: No anxiety, insomnia or bipolar disorder   PAST MEDICAL HISTORY: Depression and migraine.   PAST SURGICAL HISTORY:  1.  Occipital surgery because of severe headache for a long time. Surgery was done 2 months ago.  2.  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  3.  C-section.  4.  Gallbladder surgery.   ALLERGIES: None.   SOCIAL HISTORY: Denies alcohol, denies smoking and denies any illegal drug use. She is currently not working because of her headache complaints.   FAMILY HISTORY: Hypertension in father.   HOME MEDICATIONS:  1.  Topamax 200 mg oral tablet, 2 tablets orally once a day.  2.  Lithium 300 mg oral, 3 capsules once a day.  3.  Effexor 150 mg oral, 1 capsule orally once a day.  PHYSICAL EXAMINATION: VITAL SIGNS: In ER, temperature 98.9, pulse on presentation 102, respirations 18 and blood pressure 134/92.  GENERAL: The patient is fully alert and oriented to time, place and person, does not appear in any acute distress. Cooperative with history-taking and physical examination.  HEENT: Head and neck atraumatic. Conjunctivae pink. Oral mucosa moist.  NECK: Supple. No JVD.  RESPIRATORY: Bilateral clear and equal air entry.  CARDIOVASCULAR: S1, S2 present, regular. No murmur.  ABDOMEN: Soft. Mild tenderness present on right upper quadrant and in costovertebral angle in the right side. Bowel sounds present and normal. No  organomegaly felt.  SKIN: No rashes.  LEGS: No edema.  NEUROLOGICAL: Power 5 out of 5. Follows commands. No gross abnormality.  PSYCHIATRIC: Does not appear in any acute psychiatric illness at this time.   IMPORTANT LABORATORY RESULTS: Pregnancy test negative. Glucose 95, BUN 7, creatinine 0.7, sodium 135, potassium 3.0, chloride 106, CO2 23. Lipase 117, albumin 3.0 and alkaline phosphate 155. WBC 7.7, hemoglobin 11.4, platelet  count 254 and MCV 80. D-dimer is 0.25. Urinalysis is cloudy and 422 WBCs with 2+ leukocyte esterase and nitrite is positive.   ASSESSMENT AND PLAN: A 33 year old female with past medical history of depression and migraine, came with recurrent nausea and abdominal pain, found having positive urinalysis.  1.  Urinary tract infection. CT of the abdomen does not show any evidence of pyelonephritis or stone. We will give her IV Rocephin and check urine culture.  2.  Intractable nausea. We will give her Zofran and Phenergan as symptomatic relief.  3.  Abdominal pain. Currently, there is not any significant finding on CT of the abdomen. Most likely this might be muscular pain. We will continue Effexor and pain medication.  4.  Hypokalemia. Will give oral replacement.  5.  Migraine headaches. We will continue Topamax.  6.  Depression. We will continue lithium.  7.  CODE STATUS: Full code.   TOTAL TIME SPENT ON THIS ADMISSION: 45 minutes.    ____________________________ Hope Pigeon Elisabeth Pigeon, MD vgv:cs D: 04/26/2013 17:14:19 ET T: 04/26/2013 18:18:31 ET JOB#: [id number]  cc: Hope Pigeon. Elisabeth Pigeon, MD, <Dictator> Dorothea Glassman, MD Altamese Dilling MD ELECTRONICALLY SIGNED 05/15/2013 8:06

## 2014-10-19 NOTE — Discharge Summary (Signed)
PATIENT NAME:  [name], [name] MR#:  [id number] DATE OF BIRTH:  23-Mar-1981  DATE OF ADMISSION:  04/26/2013 DATE OF DISCHARGE:  04/27/2013  PRIMARY CARE PHYSICIAN:  Dr. Cliffton AstersWhite.  DISCHARGE DIAGNOSES:  1.  Urinary tract infection.  2.  Vomiting.  3.  Dehydration.   IMAGING STUDIES DONE:  Include a CT scan of the abdomen and pelvis with contrast, which showed no acute abnormalities.   Ultrasound of the abdomen showed mild biliary ductal prominence secondary to prior cholecystectomy. No other problems found.   ADMITTING HISTORY AND PHYSICAL:  Please see detailed H and P dictated by Dr. Elisabeth PigeonVachhani. In brief, a 34 year old female patient with history of chronic on and off abdominal pain, recurrent UTIs, presented to the hospital complaining of abdominal pain. The patient was found to have a UTI again. A CT scan and ultrasound of the abdomen showed no significant abnormalities, admitted to the hospitalist service.   HOSPITAL COURSE:  The patient was started on IV antibiotics along with IV fluids, antinausea medication. By day 2 with antibiotics and improving UIT, the patient was afebrile, normal white count and her nausea had resolved, abdominal pain almost down to 2/10.   On examination, the patient does not have any abdominal tenderness, no CVA tenderness, still has mild dysuria and will be discharged home on oral antibiotics for 3 more days.   DISCHARGE MEDICATIONS: Include:  1.  Ciprofloxacin 5 mg oral b.i.d.  2.  Effexor 150 mg oral once a day.  3.  Lithium 900 mg oral once a day.  4.  Topamax 200 mg 2 tablets oral once a day.  5.  Promethazine 12.5 mg oral 3 times a day as needed for nausea or vomiting.  6.  Ultracet 37.5/325 mg oral 3 times a day as needed for pain.   DISCHARGE INSTRUCTIONS:  Regular diet. Activity as tolerated. Follow up with primary care physician in 1 to 2 weeks.   TIME SPENT ON DAY OF DISCHARGE IN DISCHARGE ACTIVITY:  Was 40 minutes.    ____________________________ Molinda BailiffSrikar R. Princeton Nabor, MD srs:jm D: 04/27/2013 13:46:18 ET T: 04/27/2013 14:39:38 ET JOB#: [id number]  cc: Wardell HeathSrikar R. Thania Woodlief, MD, <Dictator>  [facility] physician [facility] Jaizon Deroos R Keirstin Musil MD ELECTRONICALLY SIGNED 04/28/2013 13:01

## 2014-10-25 DIAGNOSIS — E538 Deficiency of other specified B group vitamins: Secondary | ICD-10-CM | POA: Insufficient documentation

## 2015-11-27 DIAGNOSIS — R790 Abnormal level of blood mineral: Secondary | ICD-10-CM | POA: Insufficient documentation

## 2018-07-15 ENCOUNTER — Encounter: Payer: Self-pay | Admitting: Emergency Medicine

## 2018-08-31 ENCOUNTER — Ambulatory Visit: Payer: 59 | Admitting: Psychiatry

## 2018-08-31 DIAGNOSIS — F3131 Bipolar disorder, current episode depressed, mild: Secondary | ICD-10-CM

## 2018-08-31 MED ORDER — LAMOTRIGINE 150 MG PO TABS: 150.0000 mg | ORAL_TABLET | Freq: Every day | ORAL | 3 refills | Status: DC

## 2018-08-31 MED ORDER — LITHIUM CARBONATE ER 450 MG PO TBCR: 900.0000 mg | EXTENDED_RELEASE_TABLET | Freq: Every day | ORAL | 0 refills | Status: DC

## 2018-08-31 MED ORDER — MIRTAZAPINE 30 MG PO TABS: 30.0000 mg | ORAL_TABLET | Freq: Every day | ORAL | 3 refills | Status: DC

## 2018-08-31 MED ORDER — RISPERIDONE 4 MG PO TABS: 4.0000 mg | ORAL_TABLET | Freq: Every day | ORAL | 3 refills | Status: DC

## 2018-08-31 NOTE — Progress Notes (Signed)
[facility]  Patient ID: Carla Gentry,  MRN: [id number]  PCP: Titus Mould, NP  Date of Evaluation: 08/31/2018 Time spent:20 minutes  Chief Complaint:   HISTORY/CURRENT STATUS: HPI Carla Gentry was seen in September of last year.  She was doing well at the time she continues her same medications. She continues to do well  Individual Medical History/ Review of Systems: Changes? :No   Allergies: Patient has no known allergies.  Current Medications:  Current Outpatient Medications:  .  lamoTRIgine (LAMICTAL) 150 MG tablet, Take 1 tablet (150 mg total) by mouth daily., Disp: 30 tablet, Rfl: 3 .  lithium carbonate (ESKALITH) 450 MG CR tablet, Take 2 tablets (900 mg total) by mouth at bedtime. For mood stabilization and bipolar mania., Disp: 60 tablet, Rfl: 0 .  mirtazapine (REMERON) 30 MG tablet, Take 1 tablet (30 mg total) by mouth at bedtime., Disp: 30 tablet, Rfl: 3 .  risperidone (RISPERDAL) 4 MG tablet, Take 1 tablet (4 mg total) by mouth at bedtime., Disp: 30 tablet, Rfl: 3 .  eletriptan (RELPAX) 20 MG tablet, Take 20 mg by mouth as needed for migraine. One tablet by mouth at onset of headache. May repeat in 2 hours if headache persists or recurs., Disp: , Rfl:  .  pregabalin (LYRICA) 150 MG capsule, Take 1-2 capsules (150-300 mg total) by mouth 2 (two) times daily. 1 cap in am, 2 caps in pm for migraine headache prophylaxis. (Patient not taking: Reported on 08/31/2018), Disp: , Rfl:  .  topiramate (TOPAMAX) 100 MG tablet, Take 2 tablets (200 mg total) by mouth at bedtime. For mood stabilization and migraine prophylaxis. (Patient not taking: Reported on 08/31/2018), Disp: 60 tablet, Rfl: 0 .  traZODone (DESYREL) 50 MG tablet, Take 1 tablet (50 mg total) by mouth at bedtime as needed and may repeat dose one time if needed for sleep. (Patient not taking: Reported on 08/31/2018), Disp: 15 tablet, Rfl: 0 .  venlafaxine XR (EFFEXOR-XR) 150 MG 24 hr capsule, Take 1 capsule  (150 mg total) by mouth daily with breakfast. For depression and anxiety. (Patient not taking: Reported on 08/31/2018), Disp: 30 capsule, Rfl: 0 Medication Side Effects: none  Family Medical/ Social History: Changes? No  MENTAL HEALTH EXAM:  There were no vitals taken for this visit.There is no height or weight on file to calculate BMI.  General Appearance: Casual  Eye Contact:  Good  Speech:  Clear and Coherent  Volume:  Normal  Mood:  Euthymic  Affect:  Appropriate  Thought Process:  Linear  Orientation:  Full (Time, Place, and Person)  Thought Content: Logical   Suicidal Thoughts:  No  Homicidal Thoughts:  No  Memory:  WNL  Judgement:  Good  Insight:  Good  Psychomotor Activity:  Normal  Concentration:  Concentration: Good  Recall:  Good  Fund of Knowledge: Good  Language: Good  Assets:  Desire for Improvement  ADL's:  Intact  Cognition: WNL  Prognosis:  Good    DIAGNOSES:    ICD-10-CM   1. Bipolar affective disorder, currently depressed, mild (HCC) F31.31     Receiving Psychotherapy: No    RECOMMENDATIONS: Cameka is to continue her same medications.  That is Risperdal 4 mg at bedtime, lithium ER 450 nightly, Lamictal 150 mg a day, Remeron 30 mg at bedtime.  She is to return in 4 months   [employer], [place]

## 2018-09-02 ENCOUNTER — Other Ambulatory Visit: Payer: Self-pay

## 2018-09-02 MED ORDER — MIRTAZAPINE 30 MG PO TABS: 30.0000 mg | ORAL_TABLET | Freq: Every day | ORAL | 1 refills | Status: DC

## 2018-12-19 ENCOUNTER — Other Ambulatory Visit: Payer: Self-pay

## 2018-12-19 MED ORDER — RISPERIDONE 4 MG PO TABS: 4.0000 mg | ORAL_TABLET | Freq: Every day | ORAL | 0 refills | Status: DC

## 2019-01-04 ENCOUNTER — Ambulatory Visit: Payer: 59 | Admitting: Psychiatry

## 2019-01-18 ENCOUNTER — Ambulatory Visit: Payer: 59 | Admitting: Physician Assistant

## 2019-02-17 ENCOUNTER — Other Ambulatory Visit: Payer: Self-pay

## 2019-02-17 MED ORDER — LITHIUM CARBONATE ER 450 MG PO TBCR: 900.0000 mg | EXTENDED_RELEASE_TABLET | Freq: Every day | ORAL | 0 refills | Status: DC

## 2019-03-15 ENCOUNTER — Encounter

## 2019-03-15 ENCOUNTER — Ambulatory Visit (INDEPENDENT_AMBULATORY_CARE_PROVIDER_SITE_OTHER): Payer: 59 | Admitting: Physician Assistant

## 2019-03-15 ENCOUNTER — Other Ambulatory Visit: Payer: Self-pay

## 2019-03-15 ENCOUNTER — Encounter: Payer: Self-pay | Admitting: Physician Assistant

## 2019-03-15 DIAGNOSIS — F3131 Bipolar disorder, current episode depressed, mild: Secondary | ICD-10-CM | POA: Diagnosis not present

## 2019-03-15 DIAGNOSIS — Z79899 Other long term (current) drug therapy: Secondary | ICD-10-CM

## 2019-03-15 DIAGNOSIS — G4700 Insomnia, unspecified: Secondary | ICD-10-CM | POA: Diagnosis not present

## 2019-03-15 MED ORDER — LITHIUM CARBONATE ER 450 MG PO TBCR: 900.0000 mg | EXTENDED_RELEASE_TABLET | Freq: Every day | ORAL | 1 refills | Status: DC

## 2019-03-15 MED ORDER — RISPERIDONE 4 MG PO TABS: 4.0000 mg | ORAL_TABLET | Freq: Every day | ORAL | 1 refills | Status: DC

## 2019-03-15 MED ORDER — MIRTAZAPINE 30 MG PO TABS: 30.0000 mg | ORAL_TABLET | Freq: Every day | ORAL | 1 refills | Status: DC

## 2019-03-15 MED ORDER — LAMOTRIGINE 150 MG PO TABS: 150.0000 mg | ORAL_TABLET | Freq: Every day | ORAL | 1 refills | Status: DC

## 2019-03-15 NOTE — Progress Notes (Signed)
[facility]  Patient ID: Carla Gentry,  MRN: [id number]  PCP: Ricardo Jericho, NP  Date of Evaluation: 03/15/2019 Time spent:15 minutes  Chief Complaint:  Chief Complaint    Follow-up      HISTORY/CURRENT STATUS: HPI for routine med check.  States she is doing very well.  She has been on the current cocktail of medication for quite a while and is still effective.  She is able to enjoy things.  Energy and motivation are good.  Not isolating.  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  She denies suicidal or homicidal thoughts. States she sleeps well with the mirtazapine.  Patient denies increased energy with decreased need for sleep, no increased talkativeness, no racing thoughts, no impulsivity or risky behaviors, no increased spending, no increased libido, no grandiosity.  Denies dizziness, syncope, seizures, numbness, tingling, tremor, tics, unsteady gait, slurred speech, confusion. Denies muscle or joint pain, stiffness, or dystonia.  Individual Medical History/ Review of Systems: Changes? :No    Past medications for mental health diagnoses include: Effexor XR, Xanax, lithium, mirtazapine, trazodone, Depakote, Latuda, BuSpar  Allergies: Patient has no known allergies.  Current Medications:  Current Outpatient Medications:  .  eletriptan (RELPAX) 20 MG tablet, Take 20 mg by mouth as needed for migraine. One tablet by mouth at onset of headache. May repeat in 2 hours if headache persists or recurs., Disp: , Rfl:  .  lamoTRIgine (LAMICTAL) 150 MG tablet, Take 1 tablet (150 mg total) by mouth daily., Disp: 90 tablet, Rfl: 1 .  lithium carbonate (ESKALITH) 450 MG CR tablet, Take 2 tablets (900 mg total) by mouth at bedtime., Disp: 180 tablet, Rfl: 1 .  mirtazapine (REMERON) 30 MG tablet, Take 1 tablet (30 mg total) by mouth at bedtime., Disp: 90 tablet, Rfl: 1 .  risperidone (RISPERDAL)  4 MG tablet, Take 1 tablet (4 mg total) by mouth at bedtime., Disp: 90 tablet, Rfl: 1 .  pregabalin (LYRICA) 150 MG capsule, Take 1-2 capsules (150-300 mg total) by mouth 2 (two) times daily. 1 cap in am, 2 caps in pm for migraine headache prophylaxis. (Patient not taking: Reported on 08/31/2018), Disp: , Rfl:  .  topiramate (TOPAMAX) 100 MG tablet, Take 2 tablets (200 mg total) by mouth at bedtime. For mood stabilization and migraine prophylaxis. (Patient not taking: Reported on 08/31/2018), Disp: 60 tablet, Rfl: 0 Medication Side Effects: none  Family Medical/ Social History: Changes? No  MENTAL HEALTH EXAM:  There were no vitals taken for this visit.There is no height or weight on file to calculate BMI.  General Appearance: Casual  Eye Contact:  Good  Speech:  Clear and Coherent  Volume:  Normal  Mood:  Euthymic  Affect:  Appropriate  Thought Process:  Goal Directed and Descriptions of Associations: Intact  Orientation:  Full (Time, Place, and Person)  Thought Content: Logical   Suicidal Thoughts:  No  Homicidal Thoughts:  No  Memory:  WNL  Judgement:  Good  Insight:  Good  Psychomotor Activity:  Normal  Concentration:  Concentration: Good  Recall:  Good  Fund of Knowledge: Good  Language: Good  Assets:  Desire for Improvement  ADL's:  Intact  Cognition: WNL  Prognosis:  Good    DIAGNOSES:    ICD-10-CM   1. Bipolar affective disorder, currently depressed, mild ([place])  F31.31   2. Encounter for long-term (current) use of medications  Z79.899  Lithium level    Comprehensive metabolic panel    TSH    Lipid panel    CBC with Differential/Platelet  3. Insomnia, unspecified type  G47.00     Receiving Psychotherapy: No    RECOMMENDATIONS:  Continue Lamictal 150 mg daily. Continue mirtazapine 30 mg nightly. Continue Risperdal 4 mg nightly. Continue lithium CR 450 mg, 2 p.o. nightly. Draw labs as above sometime within the next month, fasting. Return in 6 months.  Melony Overlyeresa  Carrolyn Hilmes, PA-C   This record has been created using [employer] software.  Chart creation errors have been sought, but may not always have been located and corrected. Such creation errors do not reflect on the standard of medical care.

## 2019-03-25 LAB — COMPREHENSIVE METABOLIC PANEL
ALT: 12 IU/L (ref 0–32)
AST: 16 IU/L (ref 0–40)
Albumin/Globulin Ratio: 2.1 (ref 1.2–2.2)
Albumin: 4.4 g/dL (ref 3.8–4.8)
Alkaline Phosphatase: 98 IU/L (ref 39–117)
BUN/Creatinine Ratio: 12 (ref 9–23)
BUN: 9 mg/dL (ref 6–20)
Bilirubin Total: 0.3 mg/dL (ref 0.0–1.2)
CO2: 22 mmol/L (ref 20–29)
Calcium: 9.9 mg/dL (ref 8.7–10.2)
Chloride: 104 mmol/L (ref 96–106)
Creatinine, Ser: 0.75 mg/dL (ref 0.57–1.00)
GFR calc Af Amer: 118 mL/min/{1.73_m2} (ref >59)
GFR calc non Af Amer: 102 mL/min/{1.73_m2} (ref >59)
Globulin, Total: 2.1 g/dL (ref 1.5–4.5)
Glucose: 83 mg/dL (ref 65–99)
Potassium: 4.1 mmol/L (ref 3.5–5.2)
Sodium: 141 mmol/L (ref 134–144)
Total Protein: 6.5 g/dL (ref 6.0–8.5)

## 2019-03-25 LAB — CBC WITH DIFFERENTIAL/PLATELET
Basophils Absolute: 0.1 10*3/uL (ref 0.0–0.2)
Basos: 1 %
EOS (ABSOLUTE): 0.3 10*3/uL (ref 0.0–0.4)
Eos: 4 %
Hematocrit: 35.5 % (ref 34.0–46.6)
Hemoglobin: 10.8 g/dL — ABNORMAL LOW (ref 11.1–15.9)
Immature Grans (Abs): 0 10*3/uL (ref 0.0–0.1)
Immature Granulocytes: 0 %
Lymphocytes Absolute: 1.8 10*3/uL (ref 0.7–3.1)
Lymphs: 29 %
MCH: 23.9 pg — ABNORMAL LOW (ref 26.6–33.0)
MCHC: 30.4 g/dL — ABNORMAL LOW (ref 31.5–35.7)
MCV: 79 fL (ref 79–97)
Monocytes Absolute: 0.6 10*3/uL (ref 0.1–0.9)
Monocytes: 9 %
Neutrophils Absolute: 3.5 10*3/uL (ref 1.4–7.0)
Neutrophils: 57 %
Platelets: 337 10*3/uL (ref 150–450)
RBC: 4.51 x10E6/uL (ref 3.77–5.28)
RDW: 14 % (ref 11.7–15.4)
WBC: 6.2 10*3/uL (ref 3.4–10.8)

## 2019-03-25 LAB — LIPID PANEL
Chol/HDL Ratio: 2.7 ratio (ref 0.0–4.4)
Cholesterol, Total: 178 mg/dL (ref 100–199)
HDL: 66 mg/dL (ref >39)
LDL Chol Calc (NIH): 99 mg/dL (ref 0–99)
Triglycerides: 71 mg/dL (ref 0–149)
VLDL Cholesterol Cal: 13 mg/dL (ref 5–40)

## 2019-03-25 LAB — LITHIUM LEVEL: Lithium Lvl: 0.5 mmol/L — ABNORMAL LOW (ref 0.6–1.2)

## 2019-03-25 LAB — TSH: TSH: 1.97 u[IU]/mL (ref 0.450–4.500)

## 2019-03-27 NOTE — Progress Notes (Signed)
Please let her know Nicoletta Dress level is good, stay on same dose.  Kidney and liver functions are good.  Lipid panel is good.  Glu is good.  All great news!

## 2019-03-29 ENCOUNTER — Telehealth: Payer: Self-pay

## 2019-03-29 NOTE — Telephone Encounter (Signed)
-----   Message from Addison Lank, PA-C sent at 03/27/2019 12:36 PM EDT ----- Please let her know Nicoletta Dress level is good, stay on same dose.  Kidney and liver functions are good.  Lipid panel is good.  Glu is good.  All great news!

## 2019-03-29 NOTE — Telephone Encounter (Signed)
Left detailed information about her lab results, stay on same doses. Call back with any questions or concerns.

## 2019-06-12 ENCOUNTER — Other Ambulatory Visit: Payer: Self-pay | Admitting: Physician Assistant

## 2019-09-13 ENCOUNTER — Other Ambulatory Visit: Payer: Self-pay

## 2019-09-13 ENCOUNTER — Encounter: Payer: Self-pay | Admitting: Physician Assistant

## 2019-09-13 ENCOUNTER — Ambulatory Visit (INDEPENDENT_AMBULATORY_CARE_PROVIDER_SITE_OTHER): Payer: 59 | Admitting: Physician Assistant

## 2019-09-13 DIAGNOSIS — G4700 Insomnia, unspecified: Secondary | ICD-10-CM

## 2019-09-13 DIAGNOSIS — F3131 Bipolar disorder, current episode depressed, mild: Secondary | ICD-10-CM | POA: Diagnosis not present

## 2019-09-13 MED ORDER — LITHIUM CARBONATE ER 450 MG PO TBCR: 900.0000 mg | EXTENDED_RELEASE_TABLET | Freq: Every day | ORAL | 1 refills | Status: DC

## 2019-09-13 MED ORDER — LAMOTRIGINE 150 MG PO TABS: 150.0000 mg | ORAL_TABLET | Freq: Every day | ORAL | 1 refills | Status: DC

## 2019-09-13 MED ORDER — RISPERIDONE 4 MG PO TABS: 4.0000 mg | ORAL_TABLET | Freq: Every day | ORAL | 1 refills | Status: DC

## 2019-09-13 MED ORDER — MIRTAZAPINE 30 MG PO TABS: 30.0000 mg | ORAL_TABLET | Freq: Every day | ORAL | 1 refills | Status: DC

## 2019-09-13 NOTE — Progress Notes (Signed)
[facility]  Patient ID: Carla Gentry,  MRN: [id number]  PCP: Titus Mould, NP  Date of Evaluation: 09/13/2019 Time spent:20 minutes  Chief Complaint:  Chief Complaint    Medication Refill      HISTORY/CURRENT STATUS: HPI for 29-month med check.  Carla Gentry states she has been doing well since the last visit.  Her medications are working just fine.  She is able to enjoy things.  Energy and motivation are good.  No easy crying.  She sleeps well most of the time.  No suicidal or homicidal thoughts.  Patient denies increased energy with decreased need for sleep, no increased talkativeness, no racing thoughts, no impulsivity or risky behaviors, no increased spending, no increased libido, no grandiosity.  Not really anxious unless triggered.  Work is going well.  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  Denies dizziness, syncope, seizures, numbness, tingling, tremor, tics, unsteady gait, slurred speech, confusion. Denies muscle or joint pain, stiffness, or dystonia.  Individual Medical History/ Review of Systems: Changes? :No    Past medications for mental health diagnoses include: Effexor XR, Xanax, lithium, mirtazapine, trazodone, Depakote, Latuda, BuSpar  Allergies: Patient has no known allergies.  Current Medications:  Current Outpatient Medications:  .  eletriptan (RELPAX) 20 MG tablet, Take 20 mg by mouth as needed for migraine. One tablet by mouth at onset of headache. May repeat in 2 hours if headache persists or recurs., Disp: , Rfl:  .  lamoTRIgine (LAMICTAL) 150 MG tablet, Take 1 tablet (150 mg total) by mouth daily., Disp: 90 tablet, Rfl: 1 .  lithium carbonate (ESKALITH) 450 MG CR tablet, Take 2 tablets (900 mg total) by mouth at bedtime., Disp: 180 tablet, Rfl: 1 .  mirtazapine (REMERON) 30 MG tablet, Take 1 tablet (30 mg total) by mouth at bedtime., Disp: 90 tablet, Rfl: 1 .  risperidone (RISPERDAL) 4 MG tablet, Take 1 tablet  (4 mg total) by mouth at bedtime., Disp: 90 tablet, Rfl: 1 .  pregabalin (LYRICA) 150 MG capsule, Take 1-2 capsules (150-300 mg total) by mouth 2 (two) times daily. 1 cap in am, 2 caps in pm for migraine headache prophylaxis. (Patient not taking: Reported on 08/31/2018), Disp: , Rfl:  .  topiramate (TOPAMAX) 100 MG tablet, Take 2 tablets (200 mg total) by mouth at bedtime. For mood stabilization and migraine prophylaxis. (Patient not taking: Reported on 08/31/2018), Disp: 60 tablet, Rfl: 0 Medication Side Effects: none  Family Medical/ Social History: Changes? No  MENTAL HEALTH EXAM:  There were no vitals taken for this visit.There is no height or weight on file to calculate BMI.  General Appearance: Casual, Neat and Well Groomed  Eye Contact:  Good  Speech:  Clear and Coherent  Volume:  Normal  Mood:  Euthymic  Affect:  Appropriate  Thought Process:  Goal Directed and Descriptions of Associations: Intact  Orientation:  Full (Time, Place, and Person)  Thought Content: Logical   Suicidal Thoughts:  No  Homicidal Thoughts:  No  Memory:  WNL  Judgement:  Good  Insight:  Good  Psychomotor Activity:  Normal  Concentration:  Concentration: Good  Recall:  Good  Fund of Knowledge: Good  Language: Good  Assets:  Desire for Improvement  ADL's:  Intact  Cognition: WNL  Prognosis:  Good  03/24/2019 lipid panel was normal, TSH normal, CMP normal, lithium level was 0.5.  DIAGNOSES:    ICD-10-CM   1. Bipolar affective disorder, currently depressed, mild ([facility])  F31.31   2. Insomnia, unspecified type  G47.00     Receiving Psychotherapy: No    RECOMMENDATIONS:  PDMP was reviewed. I spent 20 minutes with her. Continue Lamictal 150 mg p.o. daily. Continue lithium CR 450 mg, 2 p.o. nightly. Continue mirtazapine 30 mg nightly. Continue Risperdal 4 mg, 1 p.o. nightly. Return in 6 months.  Donnal Moat, PA-C

## 2020-03-11 ENCOUNTER — Other Ambulatory Visit: Payer: Self-pay

## 2020-03-11 ENCOUNTER — Encounter: Payer: Self-pay | Admitting: Physician Assistant

## 2020-03-11 ENCOUNTER — Ambulatory Visit (INDEPENDENT_AMBULATORY_CARE_PROVIDER_SITE_OTHER): Payer: 59 | Admitting: Physician Assistant

## 2020-03-11 DIAGNOSIS — G4700 Insomnia, unspecified: Secondary | ICD-10-CM | POA: Diagnosis not present

## 2020-03-11 DIAGNOSIS — F319 Bipolar disorder, unspecified: Secondary | ICD-10-CM | POA: Diagnosis not present

## 2020-03-11 DIAGNOSIS — Z79899 Other long term (current) drug therapy: Secondary | ICD-10-CM | POA: Diagnosis not present

## 2020-03-11 MED ORDER — MIRTAZAPINE 30 MG PO TABS: 30.0000 mg | ORAL_TABLET | Freq: Every day | ORAL | 1 refills | Status: DC

## 2020-03-11 MED ORDER — RISPERIDONE 4 MG PO TABS: 4.0000 mg | ORAL_TABLET | Freq: Every day | ORAL | 1 refills | Status: DC

## 2020-03-11 MED ORDER — LITHIUM CARBONATE ER 450 MG PO TBCR
900.0000 mg | EXTENDED_RELEASE_TABLET | Freq: Every day | ORAL | 1 refills | Status: DC
Start: 2020-03-11 — End: 2020-08-15

## 2020-03-11 MED ORDER — LAMOTRIGINE 150 MG PO TABS: 150.0000 mg | ORAL_TABLET | Freq: Every day | ORAL | 1 refills | Status: DC

## 2020-03-11 NOTE — Progress Notes (Signed)
[facility]  Patient ID: Carla Gentry,  MRN: [id number]  PCP: Titus Mould, NP  Date of Evaluation: 03/11/2020 Time spent:20 minutes  Chief Complaint:  Chief Complaint    Medication Refill      HISTORY/CURRENT STATUS: HPI for 43-month med check.  Doing great!  She states that her medications are working just fine.  She is able to enjoy things.  Energy and motivation are good.  No easy crying.  She sleeps well most of the time.  No suicidal or homicidal thoughts.  Not really anxious unless triggered.  Work is going well.  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  That can be stressful, but overall she does well in her job.  Patient denies increased energy with decreased need for sleep, no increased talkativeness, no racing thoughts, no impulsivity or risky behaviors, no increased spending, no increased libido, no grandiosity, no increased irritability or anger, and no hallucinations.  Denies dizziness, syncope, seizures, numbness, tingling, tremor, tics, unsteady gait, slurred speech, confusion. Denies muscle or joint pain, stiffness, or dystonia.  No increased hunger or thirst, no increased urination, no sores that will not heal.  Individual Medical History/ Review of Systems: Changes? :No    Past medications for mental health diagnoses include: Effexor XR, Xanax, lithium, mirtazapine, trazodone, Depakote, Latuda, BuSpar  Allergies: Patient has no known allergies.  Current Medications:  Current Outpatient Medications:  .  eletriptan (RELPAX) 20 MG tablet, Take 20 mg by mouth as needed for migraine. One tablet by mouth at onset of headache. May repeat in 2 hours if headache persists or recurs., Disp: , Rfl:  .  lamoTRIgine (LAMICTAL) 150 MG tablet, Take 1 tablet (150 mg total) by mouth daily., Disp: 90 tablet, Rfl: 1 .  lithium carbonate (ESKALITH) 450 MG CR tablet, Take 2 tablets (900 mg total) by mouth at bedtime., Disp: 180 tablet,  Rfl: 1 .  mirtazapine (REMERON) 30 MG tablet, Take 1 tablet (30 mg total) by mouth at bedtime., Disp: 90 tablet, Rfl: 1 .  risperidone (RISPERDAL) 4 MG tablet, Take 1 tablet (4 mg total) by mouth at bedtime., Disp: 90 tablet, Rfl: 1 .  pregabalin (LYRICA) 150 MG capsule, Take 1-2 capsules (150-300 mg total) by mouth 2 (two) times daily. 1 cap in am, 2 caps in pm for migraine headache prophylaxis. (Patient not taking: Reported on 08/31/2018), Disp: , Rfl:  .  topiramate (TOPAMAX) 100 MG tablet, Take 2 tablets (200 mg total) by mouth at bedtime. For mood stabilization and migraine prophylaxis. (Patient not taking: Reported on 08/31/2018), Disp: 60 tablet, Rfl: 0 Medication Side Effects: none  Family Medical/ Social History: Changes? No  MENTAL HEALTH EXAM:  There were no vitals taken for this visit.There is no height or weight on file to calculate BMI.  General Appearance: Casual, Neat and Well Groomed  Eye Contact:  Good  Speech:  Clear and Coherent  Volume:  Normal  Mood:  Euthymic  Affect:  Appropriate  Thought Process:  Goal Directed and Descriptions of Associations: Intact  Orientation:  Full (Time, Place, and Person)  Thought Content: Logical   Suicidal Thoughts:  No  Homicidal Thoughts:  No  Memory:  WNL  Judgement:  Good  Insight:  Good  Psychomotor Activity:  Normal  Concentration:  Concentration: Good and Attention Span: Good  Recall:  Good  Fund of Knowledge: Good  Language: Good  Assets:  Desire for Improvement  ADL's:  Intact  Cognition: WNL  Prognosis:  Good   DIAGNOSES:    ICD-10-CM   1. Bipolar I disorder (HCC)  F31.9   2. Insomnia, unspecified type  G47.00   3. Encounter for long-term (current) use of medications  Z79.899 Lithium level    Comprehensive metabolic panel    Lipid panel    Lamotrigine level    TSH    CBC with Differential/Platelet    Hemoglobin A1c    Receiving Psychotherapy: No    RECOMMENDATIONS:  PDMP was reviewed. I provided 20 minutes  of face-to-face time during this encounter. Continue Lamictal 150 mg p.o. daily. Continue lithium CR 450 mg, 2 p.o. nightly. Continue mirtazapine 30 mg nightly. Continue Risperdal 4 mg, 1 p.o. nightly. Labs are due soon, ordered as above. Return in 6 months.  Melony Overly, PA-C

## 2020-04-28 ENCOUNTER — Other Ambulatory Visit: Payer: Self-pay | Admitting: Physician Assistant

## 2020-08-15 ENCOUNTER — Other Ambulatory Visit: Payer: Self-pay

## 2020-08-15 ENCOUNTER — Ambulatory Visit (INDEPENDENT_AMBULATORY_CARE_PROVIDER_SITE_OTHER): Payer: 59 | Admitting: Physician Assistant

## 2020-08-15 DIAGNOSIS — F319 Bipolar disorder, unspecified: Secondary | ICD-10-CM

## 2020-08-15 DIAGNOSIS — G4700 Insomnia, unspecified: Secondary | ICD-10-CM

## 2020-08-15 MED ORDER — RISPERIDONE 4 MG PO TABS: 4.0000 mg | ORAL_TABLET | Freq: Every day | ORAL | 3 refills | Status: DC

## 2020-08-15 MED ORDER — MIRTAZAPINE 30 MG PO TABS: 30.0000 mg | ORAL_TABLET | Freq: Every day | ORAL | 3 refills | Status: DC

## 2020-08-15 MED ORDER — LITHIUM CARBONATE ER 450 MG PO TBCR: 900.0000 mg | EXTENDED_RELEASE_TABLET | Freq: Every day | ORAL | 3 refills | Status: DC

## 2020-08-15 MED ORDER — LAMOTRIGINE 150 MG PO TABS: 150.0000 mg | ORAL_TABLET | Freq: Every day | ORAL | 3 refills | Status: DC

## 2020-08-15 NOTE — Progress Notes (Signed)
[facility]  Patient ID: Carla Gentry,  MRN: [id number]  PCP: Titus Mould, NP  Date of Evaluation: 08/15/2020 Time spent:30 minutes  Chief Complaint:  Chief Complaint    Depression; Insomnia; Follow-up      HISTORY/CURRENT STATUS: for 43-month med check.  Doing well. Meds are working. Is able to enjoy things. Energy and motivation are ok. Not isolating. Not crying easily. Sleeps well w/ mirtazapine. Appetite and weight are stable. No SI/HI.   Not anxious at least most of the time.  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    Patient denies increased energy with decreased need for sleep, no increased talkativeness, no racing thoughts, no impulsivity or risky behaviors, no increased spending, no increased libido, no grandiosity, no increased irritability or anger, no paranoia, and no hallucinations.  States she had labs in the fall. They're not in chart.  Denies dizziness, syncope, seizures, numbness, tingling, tremor, tics, unsteady gait, slurred speech, confusion. Denies muscle or joint pain, stiffness, or dystonia.  No increased hunger or thirst, no increased urination, no sores that will not heal.  Individual Medical History/ Review of Systems: Changes? :No    Past medications for mental health diagnoses include: Effexor XR, Xanax, lithium, mirtazapine, trazodone, Depakote, Latuda, BuSpar  Allergies: Patient has no known allergies.  Current Medications:  Current Outpatient Medications:  .  eletriptan (RELPAX) 20 MG tablet, Take 20 mg by mouth as needed for migraine. One tablet by mouth at onset of headache. May repeat in 2 hours if headache persists or recurs., Disp: , Rfl:  .  lamoTRIgine (LAMICTAL) 150 MG tablet, Take 1 tablet (150 mg total) by mouth daily., Disp: 90 tablet, Rfl: 3 .  lithium carbonate (ESKALITH) 450 MG CR tablet, Take 2 tablets (900 mg total) by mouth at bedtime.,  Disp: 180 tablet, Rfl: 3 .  mirtazapine (REMERON) 30 MG tablet, Take 1 tablet (30 mg total) by mouth at bedtime., Disp: 90 tablet, Rfl: 3 .  pregabalin (LYRICA) 150 MG capsule, Take 1-2 capsules (150-300 mg total) by mouth 2 (two) times daily. 1 cap in am, 2 caps in pm for migraine headache prophylaxis. (Patient not taking: No sig reported), Disp: , Rfl:  .  risperidone (RISPERDAL) 4 MG tablet, Take 1 tablet (4 mg total) by mouth at bedtime., Disp: 90 tablet, Rfl: 3 .  topiramate (TOPAMAX) 100 MG tablet, Take 2 tablets (200 mg total) by mouth at bedtime. For mood stabilization and migraine prophylaxis. (Patient not taking: No sig reported), Disp: 60 tablet, Rfl: 0 Medication Side Effects: none  Family Medical/ Social History: Changes? No  MENTAL HEALTH EXAM:  There were no vitals taken for this visit.There is no height or weight on file to calculate BMI.  General Appearance: Casual, Neat and Well Groomed  Eye Contact:  Good  Speech:  Clear and Coherent  Volume:  Normal  Mood:  Euthymic  Affect:  Appropriate  Thought Process:  Goal Directed and Descriptions of Associations: Intact  Orientation:  Full (Time, Place, and Person)  Thought Content: Logical   Suicidal Thoughts:  No  Homicidal Thoughts:  No  Memory:  WNL  Judgement:  Good  Insight:  Good  Psychomotor Activity:  Normal  Concentration:  Concentration: Good and Attention Span: Good  Recall:  Good  Fund of Knowledge: Good  Language: Good  Assets:  Desire for Improvement  ADL's:  Intact  Cognition: WNL  Prognosis:  Good   Most recent pertinent labs: 04/23/2019 CBC normal except hemoglobin 10.8.  CMP normal with glucose 83, lipid panel completely normal.  TSH 1.970 which is normal, lithium was 0.5.   DIAGNOSES:    ICD-10-CM   1. Bipolar I disorder (HCC)  F31.9   2. Insomnia, unspecified type  G47.00     Receiving Psychotherapy: No    RECOMMENDATIONS:  PDMP was reviewed. I provided 20 minutes of face-to-face time  during this encounter discussing her response to the meds as well as chart reviewed before and after her visit.  I stressed the importance of having labs.  She understands that Risperdal or any of the antipsychotics can possibly cause increased glucose and lipid profile.  Also with lithium we need to make sure the dose is appropriate and especially not too high, and check kidney functions.  She understands and accepts the risks of not having labs routinely.  She has previous order from September of last year.  She is doing well so no changes in meds will be made. Continue Lamictal 150 mg p.o. daily. Continue lithium CR 450 mg, 2 p.o. nightly. Continue mirtazapine 30 mg nightly. Continue Risperdal 4 mg, 1 p.o. nightly. Return in 6 months.  Melony Overly, PA-C

## 2020-08-24 ENCOUNTER — Encounter: Payer: Self-pay | Admitting: Physician Assistant

## 2020-09-10 ENCOUNTER — Ambulatory Visit: Payer: 59 | Admitting: Physician Assistant

## 2021-02-01 ENCOUNTER — Other Ambulatory Visit: Payer: Self-pay | Admitting: Physician Assistant

## 2021-02-13 ENCOUNTER — Ambulatory Visit: Payer: 59 | Admitting: Physician Assistant

## 2021-03-12 ENCOUNTER — Ambulatory Visit: Payer: 59 | Admitting: Physician Assistant

## 2021-04-30 ENCOUNTER — Encounter: Payer: Self-pay | Admitting: Physician Assistant

## 2021-04-30 ENCOUNTER — Other Ambulatory Visit: Payer: Self-pay

## 2021-04-30 ENCOUNTER — Ambulatory Visit: Payer: 59 | Admitting: Physician Assistant

## 2021-04-30 DIAGNOSIS — F319 Bipolar disorder, unspecified: Secondary | ICD-10-CM | POA: Diagnosis not present

## 2021-04-30 DIAGNOSIS — G4700 Insomnia, unspecified: Secondary | ICD-10-CM | POA: Diagnosis not present

## 2021-04-30 MED ORDER — MIRTAZAPINE 30 MG PO TABS: 30.0000 mg | ORAL_TABLET | Freq: Every day | ORAL | 3 refills | Status: DC

## 2021-04-30 MED ORDER — RISPERIDONE 1 MG PO TABS: ORAL_TABLET | ORAL | 1 refills | Status: DC

## 2021-04-30 NOTE — Progress Notes (Signed)
[facility]  Patient ID: ATLAS KUC,  MRN: [id number]  PCP: Titus Mould, NP  Date of Evaluation: 04/30/2021 Time spent:20 minutes  Chief Complaint:  Chief Complaint   Anxiety; Depression      HISTORY/CURRENT STATUS: For routine med check.  Doing well.  She decided to stop the lithium and Lamictal about 6 months ago.  She felt like she was on too many medications.  She has had no problems with mood being off of them.  She asks about either decreasing the Risperdal or changing to a different medication altogether because of decreased libido.  Before she was started on the Risperdal increased libido was one of the symptoms of bipolar disorder.  Now she feels like the pendulum has swung the other way.  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  Work is going well but is busy.  No complaints of insomnia or anxiety.  Patient denies loss of interest in usual activities and is able to enjoy things.  Denies decreased energy or motivation.  Appetite has not changed.  No extreme sadness, tearfulness, or feelings of hopelessness.  Denies any changes in concentration, making decisions or remembering things.  Denies suicidal or homicidal thoughts.  Patient denies increased energy with decreased need for sleep, no increased talkativeness, no racing thoughts, no impulsivity or risky behaviors, no increased spending, no increased libido, no grandiosity, no increased irritability or anger, no paranoia, and no hallucinations.  Denies dizziness, syncope, seizures, numbness, tingling, tremor, tics, unsteady gait, slurred speech, confusion. Denies muscle or joint pain, stiffness, or dystonia.  No increased hunger or thirst, no increased urination, no sores that will not heal.  Individual Medical History/ Review of Systems: Changes? :No    Past medications for mental health diagnoses include: Effexor XR, Xanax, lithium, mirtazapine, trazodone,  Depakote, Latuda, BuSpar  Allergies: Patient has no known allergies.  Current Medications:  Current Outpatient Medications:    risperiDONE (RISPERDAL) 1 MG tablet, 3 po qhs for 2 weeks, then 2 po qhs., Disp: 90 tablet, Rfl: 1   eletriptan (RELPAX) 20 MG tablet, Take 20 mg by mouth as needed for migraine. One tablet by mouth at onset of headache. May repeat in 2 hours if headache persists or recurs. (Patient not taking: Reported on 04/30/2021), Disp: , Rfl:    lamoTRIgine (LAMICTAL) 150 MG tablet, Take 1 tablet (150 mg total) by mouth daily. (Patient not taking: Reported on 04/30/2021), Disp: 90 tablet, Rfl: 3   lithium carbonate (ESKALITH) 450 MG CR tablet, Take 2 tablets (900 mg total) by mouth at bedtime. (Patient not taking: Reported on 04/30/2021), Disp: 180 tablet, Rfl: 3   mirtazapine (REMERON) 30 MG tablet, Take 1 tablet (30 mg total) by mouth at bedtime., Disp: 90 tablet, Rfl: 3   pregabalin (LYRICA) 150 MG capsule, Take 1-2 capsules (150-300 mg total) by mouth 2 (two) times daily. 1 cap in am, 2 caps in pm for migraine headache prophylaxis. (Patient not taking: No sig reported), Disp: , Rfl:    topiramate (TOPAMAX) 100 MG tablet, Take 2 tablets (200 mg total) by mouth at bedtime. For mood stabilization and migraine prophylaxis. (Patient not taking: No sig reported), Disp: 60 tablet, Rfl: 0 Medication Side Effects: sexual dysfunction  Family Medical/ Social History: Changes? No  MENTAL HEALTH EXAM:  There were no vitals taken for this visit.There is no height or weight on file to calculate BMI.  General Appearance: Casual, Neat and Well Groomed  Eye Contact:  Good  Speech:  Clear and Coherent  Volume:  Normal  Mood:  Anxious  Affect:  Anxious  Thought Process:  Goal Directed and Descriptions of Associations: Intact  Orientation:  Full (Time, Place, and Person)  Thought Content: Logical   Suicidal Thoughts:  No  Homicidal Thoughts:  No  Memory:  WNL  Judgement:  Good  Insight:   Good  Psychomotor Activity:  Restlessness  Concentration:  Concentration: Good and Attention Span: Good  Recall:  Good  Fund of Knowledge: Good  Language: Good  Assets:  Desire for Improvement  ADL's:  Intact  Cognition: WNL  Prognosis:  Good     DIAGNOSES:    ICD-10-CM   1. Bipolar I disorder (HCC)  F31.9     2. Insomnia, unspecified type  G47.00        Receiving Psychotherapy: No    RECOMMENDATIONS:  PDMP was reviewed.  No results available. I provided 20 minutes of face to face time during this encounter, including time spent before and after the visit in records review, medical decision making, and charting.  We discussed the sexual dysfunction.  Mirtazapine is the most likely cause out of the 2 meds she is taking now, but it is reasonable to think if the bipolar disorder is "too well-controlled" that libido would have suffered.  Since she is doing so well I am fine with decreasing the Risperdal dose and we will see how she does.  She would like to try that.  We also discussed changing to a different antipsychotic, but it is hard to say if she would have the same problems from another psychotic.  Antipsychotic. Continue mirtazapine 30 mg nightly. Decrease Risperdal to 3 mg p.o. nightly for 2 weeks then 2 mg p.o. nightly until next visit. Return in 6 months.  Melony Overly, PA-C

## 2021-05-22 ENCOUNTER — Other Ambulatory Visit: Payer: Self-pay | Admitting: Physician Assistant

## 2021-06-27 ENCOUNTER — Other Ambulatory Visit: Payer: Self-pay

## 2021-06-27 ENCOUNTER — Ambulatory Visit (INDEPENDENT_AMBULATORY_CARE_PROVIDER_SITE_OTHER): Payer: 59 | Admitting: Physician Assistant

## 2021-06-27 ENCOUNTER — Encounter: Payer: Self-pay | Admitting: Physician Assistant

## 2021-06-27 DIAGNOSIS — F19981 Other psychoactive substance use, unspecified with psychoactive substance-induced sexual dysfunction: Secondary | ICD-10-CM

## 2021-06-27 DIAGNOSIS — F319 Bipolar disorder, unspecified: Secondary | ICD-10-CM

## 2021-06-27 DIAGNOSIS — G4700 Insomnia, unspecified: Secondary | ICD-10-CM | POA: Diagnosis not present

## 2021-06-27 MED ORDER — MIRTAZAPINE 7.5 MG PO TABS: 7.5000 mg | ORAL_TABLET | Freq: Every evening | ORAL | 0 refills | Status: DC | PRN

## 2021-06-27 MED ORDER — ZALEPLON 10 MG PO CAPS: ORAL_CAPSULE | ORAL | 1 refills | Status: DC

## 2021-06-27 MED ORDER — RISPERIDONE 2 MG PO TABS: 2.0000 mg | ORAL_TABLET | Freq: Every day | ORAL | 1 refills | Status: DC

## 2021-06-27 NOTE — Progress Notes (Signed)
[facility]  Patient ID: Carla Gentry,  MRN: [id number]  PCP: Titus Mould, NP  Date of Evaluation: 06/27/2021 Time spent:20 minutes  Chief Complaint:  Chief Complaint   Follow-up       HISTORY/CURRENT STATUS: For routine med check.  Six-seven weeks ago, we decreased Risperdal, pt wanted to see how she did on a lower dose, also wondered if that may be the cause of decreased libido. Mood has been stable, but no effect on libido.   Takes Mirtazapine for sleep. Has been on it for years. Can't sleep if she doesn't take it.   Patient denies loss of interest in usual activities and is able to enjoy things.  Denies decreased energy or motivation.  Appetite has not changed.  No extreme sadness, tearfulness, or feelings of hopelessness.  Denies any changes in concentration, making decisions or remembering things.  Denies suicidal or homicidal thoughts.  Patient denies increased energy with decreased need for sleep, no increased talkativeness, no racing thoughts, no impulsivity or risky behaviors, no increased spending, no increased libido, no grandiosity, no increased irritability or anger, no paranoia, and no hallucinations.  Denies dizziness, syncope, seizures, numbness, tingling, tremor, tics, unsteady gait, slurred speech, confusion. Denies muscle or joint pain, stiffness, or dystonia.  No increased hunger or thirst, no increased urination, no sores that will not heal.  Individual Medical History/ Review of Systems: Changes? :No    Past medications for mental health diagnoses include: Effexor XR, Xanax, lithium, mirtazapine, trazodone, Depakote, Latuda, BuSpar, Ambien  Allergies: Patient has no known allergies.  Current Medications:  Current Outpatient Medications:    mirtazapine (REMERON) 7.5 MG tablet, Take 1-2 tablets (7.5-15 mg total) by mouth at bedtime as needed., Disp: 30 tablet, Rfl: 0   risperiDONE (RISPERDAL) 2 MG tablet, Take 1 tablet (2 mg  total) by mouth at bedtime., Disp: 90 tablet, Rfl: 1   zaleplon (SONATA) 10 MG capsule, 1 po qhs prn and may repeat 1 for midnocturnal awakening as long as she has 3 hours left to sleep., Disp: 60 capsule, Rfl: 1   topiramate (TOPAMAX) 100 MG tablet, Take 2 tablets (200 mg total) by mouth at bedtime. For mood stabilization and migraine prophylaxis., Disp: 60 tablet, Rfl: 0 Medication Side Effects: sexual dysfunction  Family Medical/ Social History: Changes? No  MENTAL HEALTH EXAM:  There were no vitals taken for this visit.There is no height or weight on file to calculate BMI.  General Appearance: Casual, Neat and Well Groomed  Eye Contact:  Good  Speech:  Clear and Coherent  Volume:  Normal  Mood:  Euthymic  Affect:  Congruent  Thought Process:  Goal Directed and Descriptions of Associations: Intact  Orientation:  Full (Time, Place, and Person)  Thought Content: Logical   Suicidal Thoughts:  No  Homicidal Thoughts:  No  Memory:  WNL  Judgement:  Good  Insight:  Good  Psychomotor Activity:  Normal  Concentration:  Concentration: Good and Attention Span: Good  Recall:  Good  Fund of Knowledge: Good  Language: Good  Assets:  Desire for Improvement  ADL's:  Intact  Cognition: WNL  Prognosis:  Good     DIAGNOSES:    ICD-10-CM   1. Bipolar I disorder (HCC)  F31.9     2. Insomnia, unspecified type  G47.00     3. 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  No controlled substances. I provided 20 minutes of face to face time during this encounter, including time spent before and after the visit in records review, medical decision making, counseling pertinent to today's visit, and charting.  Discussed sexual dysfunction. Mirtazapine is the most likely cause, as she had no benefit after decreasing dose of Risperdal. Had no adverse effects of that dose decrease.  Wean off  Mirtapapine by taking 15 mg qhs for a week, then 7.5 mg qhs for a week, then stop. (She understands that directions on med list are different.) Start Sonata 10 mg, 1 po qhs prn and may take 1 for midnocturnal awakening as long as she has 3 hours left to sleep.  Continue Risperdal 2 mg, 1 qhs. Return in 6 weeks.  Melony Overly, PA-C

## 2021-06-30 ENCOUNTER — Other Ambulatory Visit: Payer: Self-pay | Admitting: Physician Assistant

## 2021-07-05 ENCOUNTER — Other Ambulatory Visit: Payer: Self-pay | Admitting: Physician Assistant

## 2021-07-07 NOTE — Telephone Encounter (Signed)
Patient called saying a PA is needed for Sonata.  I took care of the mirtazapine refill request.

## 2021-07-07 NOTE — Telephone Encounter (Signed)
Pt LVM stating that she had appt on 12/30.  She was prescribed Sonata.  The pharmacy says they need a PA.  Next appt 2/17

## 2021-07-09 ENCOUNTER — Telehealth: Payer: Self-pay | Admitting: Physician Assistant

## 2021-07-09 NOTE — Telephone Encounter (Signed)
Noted thank you

## 2021-07-09 NOTE — Telephone Encounter (Signed)
PA needed with [facility] [phone] for Felipa Furnace daily dose exceeded ID [id number].Please start PA.

## 2021-07-09 NOTE — Telephone Encounter (Signed)
I did a search in Cover My Meds and did not see anything, pending or approved, for patient.

## 2021-07-09 NOTE — Telephone Encounter (Signed)
Pt LVM again today requesting a call back on if the PA was done for the Sonata or if she will be prescribed something else.  Next appt 2/17

## 2021-07-10 NOTE — Telephone Encounter (Signed)
Prior Approval received for ZALEPLON 10 MG #60 effective 07/10/2021-07/09/2024 with [facility]

## 2021-07-11 NOTE — Telephone Encounter (Signed)
Patient notified and she had refills on file.

## 2021-08-15 ENCOUNTER — Other Ambulatory Visit: Payer: Self-pay

## 2021-08-15 ENCOUNTER — Ambulatory Visit: Payer: 59 | Admitting: Physician Assistant

## 2021-08-15 ENCOUNTER — Encounter: Payer: Self-pay | Admitting: Physician Assistant

## 2021-08-15 DIAGNOSIS — F319 Bipolar disorder, unspecified: Secondary | ICD-10-CM | POA: Diagnosis not present

## 2021-08-15 DIAGNOSIS — G4700 Insomnia, unspecified: Secondary | ICD-10-CM

## 2021-08-15 MED ORDER — ZALEPLON 10 MG PO CAPS: ORAL_CAPSULE | ORAL | 5 refills | Status: DC

## 2021-08-15 NOTE — Progress Notes (Signed)
[facility]  Patient ID: Carla Gentry,  MRN: [id number]  PCP: Titus Mould, NP  Date of Evaluation: 08/15/2021 Time spent:20 minutes  Chief Complaint:  Chief Complaint   Insomnia; Follow-up     HISTORY/CURRENT STATUS: For routine med check.  At LOV, mirtazepine was stopped and Sonata started. It has been working well. She's able to fall asleep better and stay asleep, most of the time.   Patient denies loss of interest in usual activities and is able to enjoy things.  Denies decreased energy or motivation.  Appetite has not changed.  No extreme sadness, tearfulness, or feelings of hopelessness.  Denies any changes in concentration, making decisions or remembering things.  Denies suicidal or homicidal thoughts.  Patient denies increased energy with decreased need for sleep, no increased talkativeness, no racing thoughts, no impulsivity or risky behaviors, no increased spending, no increased libido, no grandiosity, no increased irritability or anger, no paranoia, and no hallucinations.  Denies dizziness, syncope, seizures, numbness, tingling, tremor, tics, unsteady gait, slurred speech, confusion. Denies muscle or joint pain, stiffness, or dystonia.  No increased hunger or thirst, no increased urination, no sores that will not heal.  Individual Medical History/ Review of Systems: Changes? :No    Past medications for mental health diagnoses include: Effexor XR, Xanax, lithium, mirtazapine, trazodone, Depakote, Latuda, BuSpar, Ambien  Allergies: Patient has no known allergies.  Current Medications:  Current Outpatient Medications:    risperiDONE (RISPERDAL) 2 MG tablet, Take 1 tablet (2 mg total) by mouth at bedtime., Disp: 90 tablet, Rfl: 1   topiramate (TOPAMAX) 100 MG tablet, Take 2 tablets (200 mg total) by mouth at bedtime. For mood stabilization and migraine prophylaxis., Disp: 60 tablet, Rfl: 0   zaleplon (SONATA) 10 MG capsule, 1 po qhs prn and may  repeat 1 for midnocturnal awakening as long as she has 3 hours left to sleep., Disp: 60 capsule, Rfl: 5 Medication Side Effects: sexual dysfunction  Family Medical/ Social History: Changes? 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   MENTAL HEALTH EXAM:  There were no vitals taken for this visit.There is no height or weight on file to calculate BMI.  General Appearance: Casual, Neat and Well Groomed  Eye Contact:  Good  Speech:  Clear and Coherent  Volume:  Normal  Mood:  Euthymic  Affect:  Congruent  Thought Process:  Goal Directed and Descriptions of Associations: Circumstantial  Orientation:  Full (Time, Place, and Person)  Thought Content: Logical   Suicidal Thoughts:  No  Homicidal Thoughts:  No  Memory:  WNL  Judgement:  Good  Insight:  Good  Psychomotor Activity:  Normal  Concentration:  Concentration: Good and Attention Span: Good  Recall:  Good  Fund of Knowledge: Good  Language: Good  Assets:  Desire for Improvement  ADL's:  Intact  Cognition: WNL  Prognosis:  Good     DIAGNOSES:    ICD-10-CM   1. Bipolar I disorder (HCC)  F31.9     2. Insomnia, unspecified type  G47.00        Receiving Psychotherapy: No    RECOMMENDATIONS:  PDMP was reviewed.  Sonata filled 08/11/2021. I provided 20  minutes of face to face time during this encounter, including time spent before and after the visit in records review, medical decision making, counseling pertinent to today's visit, and charting.  Doing well w/ med so no changes needed.   Continue Sonata 10 mg, 1 po qhs prn and may take  1 for midnocturnal awakening as long as she has 3 hours left to sleep.  Continue Risperdal 2 mg, 1 qhs. Return in 6 months.   Melony Overly, PA-C

## 2021-09-07 ENCOUNTER — Other Ambulatory Visit: Payer: Self-pay | Admitting: Physician Assistant

## 2021-10-07 ENCOUNTER — Encounter: Payer: Self-pay | Admitting: Emergency Medicine

## 2021-10-07 ENCOUNTER — Ambulatory Visit
Admission: EM | Admit: 2021-10-07 | Discharge: 2021-10-07 | Disposition: A | Discharge status: Home / Self Care | Payer: 59 | Attending: Student | Admitting: Student

## 2021-10-07 DIAGNOSIS — N3000 Acute cystitis without hematuria: Secondary | ICD-10-CM | POA: Diagnosis present

## 2021-10-07 DIAGNOSIS — Z975 Presence of (intrauterine) contraceptive device: Secondary | ICD-10-CM | POA: Insufficient documentation

## 2021-10-07 LAB — POCT URINALYSIS DIP (MANUAL ENTRY)
Bilirubin, UA: NEGATIVE
Blood, UA: NEGATIVE
Glucose, UA: NEGATIVE mg/dL
Nitrite, UA: NEGATIVE
Protein Ur, POC: NEGATIVE mg/dL
Spec Grav, UA: 1.025 (ref 1.010–1.025)
Urobilinogen, UA: 0.2 E.U./dL
pH, UA: 5.5 (ref 5.0–8.0)

## 2021-10-07 MED ORDER — CEPHALEXIN 500 MG PO CAPS: 500.0000 mg | ORAL_CAPSULE | Freq: Four times a day (QID) | ORAL | 0 refills | Status: DC

## 2021-10-07 NOTE — Discharge Instructions (Addendum)
-  Start the antibiotic: Keflex, 4x daily x5 days. You can take this with food if you have a sensitive stomach. ?-Drink plenty of fluids ?-Follow-up if symptoms worsen instead of improve, like worsening abdominal pain, new flank pain, new fever/chills, new vaginal symptoms like discharge. ?

## 2021-10-07 NOTE — ED Provider Notes (Signed)
?[facility] ? ? ? ?CSN: [id number] ?Arrival date & time: 10/07/21  1628 ? ? ?  ? ?History   ?Chief Complaint ?Chief Complaint  ?Patient presents with  ? Dysuria  ? Urinary Frequency  ? ? ?HPI ?LETHA MIRABAL is a 41 y.o. female presenting with dysuria and frequency for 6 days.  History noncontributory, states it has been years since last UTI.  States she is peeing a lot throughout the day.  Minimal suprapubic pressure.  Denies flank pain, fever/chills, hematuria, vaginal symptoms like discharge.  IUD contraception. ? ?HPI ? ?Past Medical History:  ?Diagnosis Date  ? Anxiety   ? Depression   ? Migraine   ? ? ?Patient Active Problem List  ? Diagnosis Date Noted  ? Opioid use with withdrawal (HCC) 03/30/2013  ? Bipolar I disorder, most recent episode (or current) depressed, severe, without mention of psychotic behavior 03/30/2013  ? Suicidal ideation 03/30/2013  ? ? ?Past Surgical History:  ?Procedure Laterality Date  ? CESAREAN SECTION    ? CHOLECYSTECTOMY    ? GASTRIC BYPASS    ? ? ?OB History   ?No obstetric history on file. ?  ? ? ? ?Home Medications   ? ?Prior to Admission medications   ?Medication Sig Start Date End Date Taking? Authorizing Provider  ?cephALEXin (KEFLEX) 500 MG capsule Take 1 capsule (500 mg total) by mouth 4 (four) times daily. 10/07/21  Yes Rhys Martini, PA-C  ?eletriptan (RELPAX) 40 MG tablet Take by mouth. 11/26/16  Yes [provider]  ?levonorgestrel (MIRENA) 20 MCG/DAY IUD by Intrauterine route.    [provider]  ?risperiDONE (RISPERDAL) 2 MG tablet Take 1 tablet (2 mg total) by mouth at bedtime. 06/27/21   Melony Overly T, PA-C  ?topiramate (TOPAMAX) 100 MG tablet Take 2 tablets (200 mg total) by mouth at bedtime. For mood stabilization and migraine prophylaxis. 04/03/13   Tamala Julian, PA-C  ?zaleplon (SONATA) 10 MG capsule 1 po qhs prn and may repeat 1 for midnocturnal awakening as long as she has 3 hours left to sleep. 08/15/21   Cherie Ouch, PA-C   ? ? ?Family History ?No family history on file. ? ?Social History ?Social History  ? ?Tobacco Use  ? Smoking status: Never  ? Smokeless tobacco: Never  ?Vaping Use  ? Vaping Use: Never used  ?Substance Use Topics  ? Alcohol use: Not Currently  ?  Comment: rarely  ? Drug use: Never  ? ? ? ?Allergies   ?Patient has no known allergies. ? ? ?Review of Systems ?Review of Systems  ?Constitutional:  Negative for appetite change, chills, diaphoresis and fever.  ?Respiratory:  Negative for shortness of breath.   ?Cardiovascular:  Negative for chest pain.  ?Gastrointestinal:  Negative for abdominal pain, blood in stool, constipation, diarrhea, nausea and vomiting.  ?Genitourinary:  Positive for dysuria and frequency. Negative for decreased urine volume, difficulty urinating, flank pain, genital sores, hematuria and urgency.  ?Musculoskeletal:  Negative for back pain.  ?Neurological:  Negative for dizziness, weakness and light-headedness.  ?All other systems reviewed and are negative. ? ? ?Physical Exam ?Triage Vital Signs ?ED Triage Vitals [10/07/21 1649]  ?Enc Vitals Group  ?   BP 116/78  ?   Pulse Rate 63  ?   Resp 16  ?   Temp 98.3 ?F (36.8 ?C)  ?   Temp Source Oral  ?   SpO2 98 %  ?   Weight   ?  Height   ?   Head Circumference   ?   Peak Flow   ?   Pain Score 0  ?   Pain Loc   ?   Pain Edu?   ?   Excl. in GC?   ? ?No data found. ? ?Updated Vital Signs ?BP 116/78 (BP Location: Left Arm)   Pulse 63   Temp 98.3 ?F (36.8 ?C) (Oral)   Resp 16   SpO2 98%  ? ?Visual Acuity ?Right Eye Distance:   ?Left Eye Distance:   ?Bilateral Distance:   ? ?Right Eye Near:   ?Left Eye Near:    ?Bilateral Near:    ? ?Physical Exam ?Vitals reviewed.  ?Constitutional:   ?   General: She is not in acute distress. ?   Appearance: Normal appearance. She is not ill-appearing.  ?HENT:  ?   Head: Normocephalic and atraumatic.  ?   Mouth/Throat:  ?   Mouth: Mucous membranes are moist.  ?   Comments: Moist mucous membranes ?Eyes:  ?   Extraocular  Movements: Extraocular movements intact.  ?   Pupils: Pupils are equal, round, and reactive to light.  ?Cardiovascular:  ?   Rate and Rhythm: Normal rate and regular rhythm.  ?   Heart sounds: Normal heart sounds.  ?Pulmonary:  ?   Effort: Pulmonary effort is normal.  ?   Breath sounds: Normal breath sounds. No wheezing, rhonchi or rales.  ?Abdominal:  ?   General: Bowel sounds are normal. There is no distension.  ?   Palpations: Abdomen is soft. There is no mass.  ?   Tenderness: There is abdominal tenderness in the suprapubic area. There is no right CVA tenderness, left CVA tenderness, guarding or rebound.  ?Skin: ?   General: Skin is warm.  ?   Capillary Refill: Capillary refill takes less than 2 seconds.  ?   Comments: Good skin turgor  ?Neurological:  ?   General: No focal deficit present.  ?   Mental Status: She is alert and oriented to person, place, and time.  ?Psychiatric:     ?   Mood and Affect: Mood normal.     ?   Behavior: Behavior normal.  ? ? ? ?UC Treatments / Results  ?Labs ?(all labs ordered are listed, but only abnormal results are displayed) ?Labs Reviewed  ?POCT URINALYSIS DIP (MANUAL ENTRY) - Abnormal; Notable for the following components:  ?    Result Value  ? Clarity, UA cloudy (*)   ? Ketones, POC UA trace (5) (*)   ? Leukocytes, UA Small (1+) (*)   ? All other components within normal limits  ? ? ?EKG ? ? ?Radiology ?No results found. ? ?Procedures ?Procedures (including critical care time) ? ?Medications Ordered in UC ?Medications - No data to display ? ?Initial Impression / Assessment and Plan / UC Course  ?I have reviewed the triage vital signs and the nursing notes. ? ?Pertinent labs & imaging results that were available during my care of the patient were reviewed by me and considered in my medical decision making (see chart for details). ? ?  ? ?This patient is a very pleasant 41 y.o. year old female presenting with acute cystitis. Afebrile, nontachycardic, minimal reproducible abd  pain and no CVAT. ? ?UA with trace leuk. Culture sent.  ?Declines cervicovaginal swab.  ?IUD contraception. ? ?Will manage as UTI with Keflex. ? ?ED return precautions discussed. Patient verbalizes understanding and agreement.  ? ?.  ? ?  Final Clinical Impressions(s) / UC Diagnoses  ? ?Final diagnoses:  ?Acute cystitis without hematuria  ?IUD contraception  ? ? ? ?Discharge Instructions   ? ?  ?-Start the antibiotic: Keflex, 4x daily x5 days. You can take this with food if you have a sensitive stomach. ?-Drink plenty of fluids ?-Follow-up if symptoms worsen instead of improve, like worsening abdominal pain, new flank pain, new fever/chills, new vaginal symptoms like discharge. ? ? ?ED Prescriptions   ? ? Medication Sig Dispense Auth. Provider  ? cephALEXin (KEFLEX) 500 MG capsule Take 1 capsule (500 mg total) by mouth 4 (four) times daily. 20 capsule Rhys MartiniGraham, Cressie Betzler E, PA-C  ? ?  ? ?PDMP not reviewed this encounter. ?  ?Rhys MartiniGraham, Kiandria Clum E, PA-C ?10/07/21 1702 ? ?

## 2021-10-07 NOTE — ED Triage Notes (Signed)
Pt presents with dysuria and urinary frequency x 6 days ?

## 2021-10-10 LAB — URINE CULTURE: Culture: >=100000 — AB

## 2021-10-30 ENCOUNTER — Other Ambulatory Visit: Payer: Self-pay | Admitting: Physician Assistant

## 2022-01-02 ENCOUNTER — Other Ambulatory Visit: Payer: Self-pay | Admitting: Physician Assistant

## 2022-01-22 ENCOUNTER — Other Ambulatory Visit (HOSPITAL_COMMUNITY)
Admission: RE | Admit: 2022-01-22 | Discharge: 2022-01-22 | Disposition: A | Discharge status: Home / Self Care | Payer: 59 | Source: Ambulatory Visit | Attending: Obstetrics and Gynecology | Admitting: Obstetrics and Gynecology

## 2022-01-22 ENCOUNTER — Ambulatory Visit: Payer: 59 | Admitting: Obstetrics and Gynecology

## 2022-01-22 ENCOUNTER — Encounter: Payer: Self-pay | Admitting: Obstetrics and Gynecology

## 2022-01-22 VITALS — BP 115/80 | HR 75 | Ht 61.0 in | Wt 118.5 lb

## 2022-01-22 DIAGNOSIS — Z124 Encounter for screening for malignant neoplasm of cervix: Secondary | ICD-10-CM

## 2022-01-22 DIAGNOSIS — Z7689 Persons encountering health services in other specified circumstances: Secondary | ICD-10-CM | POA: Diagnosis not present

## 2022-01-22 DIAGNOSIS — Z01419 Encounter for gynecological examination (general) (routine) without abnormal findings: Secondary | ICD-10-CM | POA: Insufficient documentation

## 2022-01-22 DIAGNOSIS — Z1231 Encounter for screening mammogram for malignant neoplasm of breast: Secondary | ICD-10-CM | POA: Diagnosis not present

## 2022-01-22 NOTE — Progress Notes (Signed)
HPI:      [name] is a 41 y.o. G1P1001 who LMP was No LMP recorded. (Menstrual status: IUD).  Subjective:   She presents today for her annual examination.  She states that she has not had a period in over 12 years.  She has had a Mirena IUD for 12 years.  She was initially told that the strings were visible but that the IUD would not come out without surgery.  She did not have insurance if she has delayed surgery until this time.     Hx: The following portions of the patient's history were reviewed and updated as appropriate:             She  has a past medical history of Anxiety, Depression, and Migraine. She does not have any pertinent problems on file. She  has a past surgical history that includes Gastric bypass; Cholecystectomy; and Cesarean section. Her family history includes Hypertension in her father. She  reports that she has never smoked. She has never used smokeless tobacco. She reports that she does not currently use alcohol. She reports that she does not use drugs. She has a current medication list which includes the following prescription(s): eletriptan, levonorgestrel, risperidone, and zaleplon. She has No Known Allergies.       Review of Systems:  Review of Systems  Constitutional: Denied constitutional symptoms, night sweats, recent illness, fatigue, fever, insomnia and weight loss.  Eyes: Denied eye symptoms, eye pain, photophobia, vision change and visual disturbance.  Ears/Nose/Throat/Neck: Denied ear, nose, throat or neck symptoms, hearing loss, nasal discharge, sinus congestion and sore throat.  Cardiovascular: Denied cardiovascular symptoms, arrhythmia, chest pain/pressure, edema, exercise intolerance, orthopnea and palpitations.  Respiratory: Denied pulmonary symptoms, asthma, pleuritic pain, productive sputum, cough, dyspnea and wheezing.  Gastrointestinal: Denied, gastro-esophageal reflux, melena, nausea and vomiting.  Genitourinary: Denied  genitourinary symptoms including symptomatic vaginal discharge, pelvic relaxation issues, and urinary complaints.  Musculoskeletal: Denied musculoskeletal symptoms, stiffness, swelling, muscle weakness and myalgia.  Dermatologic: Denied dermatology symptoms, rash and scar.  Neurologic: Denied neurology symptoms, dizziness, headache, neck pain and syncope.  Psychiatric: Denied psychiatric symptoms, anxiety and depression.  Endocrine: Denied endocrine symptoms including hot flashes and night sweats.   Meds:   Current Outpatient Medications on File Prior to Visit  Medication Sig Dispense Refill   eletriptan (RELPAX) 40 MG tablet Take by mouth.     levonorgestrel (MIRENA) 20 MCG/DAY IUD by Intrauterine route.     risperiDONE (RISPERDAL) 2 MG tablet TAKE 1 TABLET BY MOUTH AT BEDTIME. 90 tablet 0   zaleplon (SONATA) 10 MG capsule TAKE 1 BY MOUTH DAILY AT BEDTIME AS NEEDED AND MAY REPEAT 1 FOR MIDNOCTURNAL AWAKENING AS LONG AS SHE HAS 3 HOURS LEFT TO SLEEP 30 capsule 3   No current facility-administered medications on file prior to visit.     Objective:     Vitals:   01/22/22 1017  BP: 115/80  Pulse: 75    Filed Weights   01/22/22 1017  Weight: 118 lb 8 oz (53.8 kg)              Physical examination General NAD, Conversant  HEENT Atraumatic; Op clear with mmm.  Normo-cephalic. Pupils reactive. Anicteric sclerae  Thyroid/Neck Smooth without nodularity or enlargement. Normal ROM.  Neck Supple.  Skin No rashes, lesions or ulceration. Normal palpated skin turgor. No nodularity.  Breasts: No masses or discharge.  Symmetric.  No axillary adenopathy.  Lungs: Clear to auscultation.No rales or wheezes. Normal  Respiratory effort, no retractions.  Heart: NSR.  No murmurs or rubs appreciated. No periferal edema  Abdomen: Soft.  Non-tender.  No masses.  No HSM. No hernia  Extremities: Moves all appropriately.  Normal ROM for age. No lymphadenopathy.  Neuro: Oriented to PPT.  Normal mood.  Normal affect.     Pelvic:   Vulva: Normal appearance.  No lesions.  Vagina: No lesions or abnormalities noted.  Support: Normal pelvic support.  Urethra No masses tenderness or scarring.  Meatus Normal size without lesions or prolapse.  Cervix: Normal appearance.  No lesions.  Anus: Normal exam.  No lesions.  Perineum: Normal exam.  No lesions.        Bimanual   Uterus: Normal size.  Non-tender.  Mobile.  AV.  Adnexae: No masses.  Non-tender to palpation.  Cul-de-sac: Negative for abnormality.     Assessment:    G1P1001 Patient Active Problem List   Diagnosis Date Noted   Opioid use with withdrawal (HCC) 03/30/2013   Bipolar I disorder, most recent episode (or current) depressed, severe, without mention of psychotic behavior 03/30/2013   Suicidal ideation 03/30/2013     1. Establishing care with new doctor, encounter for   2. Well woman exam with routine gynecological exam   3. Cervical cancer screening   4. Screening mammogram for breast cancer     IUD strings not visible.  Possible history of missed placed IUD ?   Plan:            1.  Basic Screening Recommendations The basic screening recommendations for asymptomatic women were discussed with the patient during her visit.  The age-appropriate recommendations were discussed with her and the rational for the tests reviewed.  When I am informed by the patient that another primary care physician has previously obtained the age-appropriate tests and they are up-to-date, only outstanding tests are ordered and referrals given as necessary.  Abnormal results of tests will be discussed with her when all of her results are completed.  Routine preventative health maintenance measures emphasized: Exercise/Diet/Weight control, Tobacco Warnings, Alcohol/Substance use risks and Stress Management  Pap performed -mammogram ordered -blood work ordered patient will return when fasting  2.  To schedule an appointment for IUD removal and  reinsertion   If unable to locate this IUD patient will require ultrasound to try to locate it.  Orders Orders Placed This Encounter  Procedures   MM DIGITAL SCREENING BILATERAL   Basic metabolic panel   CBC   TSH   Lipid panel   Hemoglobin A1c    No orders of the defined types were placed in this encounter.         F/U  Return in about 2 weeks (around 02/05/2022).  Elonda Husky, M.D. 01/22/2022 10:54 AM

## 2022-01-22 NOTE — Progress Notes (Signed)
Patients presents for annual exam today. She states having an IUD that was placed 13 years ago, has not followed up in approximately 8 years. Patient is due for pap smear, ordered. Patient is due for mammogram, ordered. Annual labs are ordered.  Patient states no other questions or concerns at this time.

## 2022-01-28 LAB — CYTOLOGY - PAP
Comment: NEGATIVE
Diagnosis: NEGATIVE
Diagnosis: REACTIVE
High risk HPV: POSITIVE — AB

## 2022-01-29 ENCOUNTER — Encounter: Payer: Self-pay | Admitting: Physician Assistant

## 2022-01-29 ENCOUNTER — Ambulatory Visit (INDEPENDENT_AMBULATORY_CARE_PROVIDER_SITE_OTHER): Payer: 59 | Admitting: Physician Assistant

## 2022-01-29 DIAGNOSIS — G4700 Insomnia, unspecified: Secondary | ICD-10-CM

## 2022-01-29 DIAGNOSIS — F319 Bipolar disorder, unspecified: Secondary | ICD-10-CM

## 2022-01-29 MED ORDER — ZOLPIDEM TARTRATE 10 MG PO TABS: 10.0000 mg | ORAL_TABLET | Freq: Every evening | ORAL | 0 refills | Status: DC | PRN

## 2022-01-29 NOTE — Progress Notes (Signed)
[facility]  Patient ID: Carla Gentry,  MRN: [id number]  PCP: Titus Mould, NP  Date of Evaluation: 01/29/2022 Time spent:20 minutes  Chief Complaint:  Chief Complaint   Follow-up    HISTORY/CURRENT STATUS: For routine med check.  Only problem is not sleeping well.  Her pharmacy (or insurance or both) will not give her 72 pills/month of the Sonata.  She does not need a second 1 every night but there are nights that she will wake up at 3 in the morning and cannot go back to sleep.  She always takes the 1 at bedtime or else she cannot fall asleep.  So that has been annoying.  She does not drink caffeine after lunch, no electronic devices close to bedtime.  Patient is able to enjoy things.  Energy and motivation are good.  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  Is off for the summer and has really enjoyed it.  No extreme sadness, tearfulness, or feelings of hopelessness.  ADLs and personal hygiene are normal.   Denies any changes in concentration, making decisions, or remembering things.  Appetite has not changed.  Weight is stable.   Denies suicidal or homicidal thoughts.  Patient denies increased energy with decreased need for sleep, increased talkativeness, racing thoughts, impulsivity or risky behaviors, increased spending, increased libido, grandiosity, increased irritability or anger, paranoia, and no hallucinations.  Denies dizziness, syncope, seizures, numbness, tingling, tremor, tics, unsteady gait, slurred speech, confusion. Denies muscle or joint pain, stiffness, or dystonia. Denies unexplained weight loss, frequent infections, or sores that heal slowly.  No polyphagia, polydipsia, or polyuria. Denies visual changes or paresthesias.   Individual Medical History/ Review of Systems: Changes? :No    Past medications for mental health diagnoses include: Effexor XR, Xanax, lithium, mirtazapine caused gain weight, trazodone, Depakote, Latuda,  BuSpar, Ambien  Allergies: Patient has no known allergies.  Current Medications:  Current Outpatient Medications:    levonorgestrel (MIRENA) 20 MCG/DAY IUD, by Intrauterine route., Disp: , Rfl:    risperiDONE (RISPERDAL) 2 MG tablet, TAKE 1 TABLET BY MOUTH AT BEDTIME., Disp: 90 tablet, Rfl: 0   zaleplon (SONATA) 10 MG capsule, TAKE 1 BY MOUTH DAILY AT BEDTIME AS NEEDED AND MAY REPEAT 1 FOR MIDNOCTURNAL AWAKENING AS LONG AS SHE HAS 3 HOURS LEFT TO SLEEP, Disp: 30 capsule, Rfl: 3   zolpidem (AMBIEN) 10 MG tablet, Take 1 tablet (10 mg total) by mouth at bedtime as needed for sleep., Disp: 30 tablet, Rfl: 0 Medication Side Effects: sexual dysfunction  Family Medical/ Social History: Changes? 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  General Appearance: Casual, Neat and Well Groomed  Eye Contact:  Good  Speech:  Clear and Coherent  Volume:  Normal  Mood:  Euthymic  Affect:  Congruent  Thought Process:  Goal Directed and Descriptions of Associations: Circumstantial  Orientation:  Full (Time, Place, and Person)  Thought Content: Logical   Suicidal Thoughts:  No  Homicidal Thoughts:  No  Memory:  WNL  Judgement:  Good  Insight:  Good  Psychomotor Activity:  Normal  Concentration:  Concentration: Good and Attention Span: Good  Recall:  Good  Fund of Knowledge: Good  Language: Good  Assets:  Desire for Improvement Financial Resources/Insurance Housing Transportation Vocational/Educational  ADL's:  Intact  Cognition: WNL  Prognosis:  Good    DIAGNOSES:  ICD-10-CM   1. Bipolar I disorder (HCC)  F31.9     2. Insomnia, unspecified type  G47.00       Receiving Psychotherapy: No   RECOMMENDATIONS:  PDMP was reviewed.  Sonata filled 01/05/2022. I provided 20 minutes of face to face time during this encounter, including time spent before and after the visit in records  review, medical decision making, counseling pertinent to today's visit, and charting.   Discussed the insomnia, she has taken Ambien in the past and it did work but it has been 20 years ago.  I recommend retrying that.  I am sending in 1 prescription for that, if her pharmacy will not provide 10 mg pills #30, tell them not to fill it and have the Sonata filled.  An alternative with the Sonata I believe would be to have her pay out of pocket.  I think the problem with the pharmacy only given her 30 pills is likely due to insurance issues not the pharmacy's policy.  I could be wrong though.  She has an appointment for fasting labs next week with her GYN.  She will see that I get those results, specifically glucose and lipid panel.  She is aware that the Risperdal can increase those levels, that is a risk with any of the atypical antipsychotics.  She accepts that risk.  Continue Risperdal 2 mg, 1 qhs. Start Ambien 10 mg, 1 p.o. nightly as needed sleep. Hold Sonata for now. Return in 6 months.  Melony Overly, PA-C

## 2022-02-01 NOTE — Progress Notes (Signed)
Pap shows Positive HPV      BUT negative cytology. Recommend repeat in 1 year.

## 2022-02-05 ENCOUNTER — Ambulatory Visit: Payer: 59 | Admitting: Obstetrics and Gynecology

## 2022-02-05 ENCOUNTER — Encounter: Payer: Self-pay | Admitting: Obstetrics and Gynecology

## 2022-02-05 VITALS — BP 116/77 | HR 76 | Ht 61.0 in | Wt 115.1 lb

## 2022-02-05 DIAGNOSIS — Z30433 Encounter for removal and reinsertion of intrauterine contraceptive device: Secondary | ICD-10-CM

## 2022-02-05 DIAGNOSIS — Z01419 Encounter for gynecological examination (general) (routine) without abnormal findings: Secondary | ICD-10-CM

## 2022-02-05 NOTE — Progress Notes (Signed)
Patient presents today for IUD removal and reinsertion. She states her current IUD has been in for 12 years.  Patient states no other questions or concerns.

## 2022-02-05 NOTE — Progress Notes (Signed)
HPI:      [name] is a 41 y.o. G1P1001 who LMP was No LMP recorded. (Menstrual status: IUD).  Subjective:   She presents today she presents today for IUD removal.  She states that has been in place for more than 12 years.  Several years ago she had a attempted to be removed and that removal was unsuccessful.  Since that time she has had an ultrasound showing it is in the uterus.  She would like it removed and she would like a Mirena reinserted.    Hx: The following portions of the patient's history were reviewed and updated as appropriate:             She  has a past medical history of Anxiety, Depression, and Migraine. She does not have any pertinent problems on file. She  has a past surgical history that includes Gastric bypass; Cholecystectomy; and Cesarean section. Her family history includes Hypertension in her father. She  reports that she has never smoked. She has never used smokeless tobacco. She reports that she does not currently use alcohol. She reports that she does not use drugs. She has a current medication list which includes the following prescription(s): levonorgestrel, risperidone, zaleplon, and zolpidem. She has No Known Allergies.       Review of Systems:  Review of Systems  Constitutional: Denied constitutional symptoms, night sweats, recent illness, fatigue, fever, insomnia and weight loss.  Eyes: Denied eye symptoms, eye pain, photophobia, vision change and visual disturbance.  Ears/Nose/Throat/Neck: Denied ear, nose, throat or neck symptoms, hearing loss, nasal discharge, sinus congestion and sore throat.  Cardiovascular: Denied cardiovascular symptoms, arrhythmia, chest pain/pressure, edema, exercise intolerance, orthopnea and palpitations.  Respiratory: Denied pulmonary symptoms, asthma, pleuritic pain, productive sputum, cough, dyspnea and wheezing.  Gastrointestinal: Denied, gastro-esophageal reflux, melena, nausea and vomiting.  Genitourinary:  Denied genitourinary symptoms including symptomatic vaginal discharge, pelvic relaxation issues, and urinary complaints.  Musculoskeletal: Denied musculoskeletal symptoms, stiffness, swelling, muscle weakness and myalgia.  Dermatologic: Denied dermatology symptoms, rash and scar.  Neurologic: Denied neurology symptoms, dizziness, headache, neck pain and syncope.  Psychiatric: Denied psychiatric symptoms, anxiety and depression.  Endocrine: Denied endocrine symptoms including hot flashes and night sweats.   Meds:   Current Outpatient Medications on File Prior to Visit  Medication Sig Dispense Refill   levonorgestrel (MIRENA) 20 MCG/DAY IUD by Intrauterine route.     risperiDONE (RISPERDAL) 2 MG tablet TAKE 1 TABLET BY MOUTH AT BEDTIME. 90 tablet 0   zaleplon (SONATA) 10 MG capsule TAKE 1 BY MOUTH DAILY AT BEDTIME AS NEEDED AND MAY REPEAT 1 FOR MIDNOCTURNAL AWAKENING AS LONG AS SHE HAS 3 HOURS LEFT TO SLEEP 30 capsule 3   zolpidem (AMBIEN) 10 MG tablet Take 1 tablet (10 mg total) by mouth at bedtime as needed for sleep. 30 tablet 0   No current facility-administered medications on file prior to visit.      Objective:     Vitals:   02/05/22 1002  BP: 116/77  Pulse: 76   Filed Weights   02/05/22 1002  Weight: 115 lb 1.6 oz (52.2 kg)              Multiple attempts at IUD removal using a tonsil and an IUD string locator were unsuccessful.  Patient became uncomfortable. Unable to remove IUD.          Assessment:    G1P1001 Patient Active Problem List   Diagnosis Date Noted   Opioid use with withdrawal (  HCC) 03/30/2013   Bipolar I disorder, most recent episode (or current) depressed, severe, without mention of psychotic behavior 03/30/2013   Suicidal ideation 03/30/2013     1. Encounter for IUD removal and reinsertion        Plan:            1.  Recommend ultrasound to confirm location of IUD.  2.  If located intrauterine patient would like it removed in the OR and a  replacement of Mirena at the same time. Orders No orders of the defined types were placed in this encounter.   No orders of the defined types were placed in this encounter.     F/U  Return in about 2 weeks (around 02/19/2022).  Elonda Husky, M.D. 02/05/2022 10:46 AM

## 2022-02-06 LAB — CBC
Hematocrit: 32.6 % — ABNORMAL LOW (ref 34.0–46.6)
Hemoglobin: 9.9 g/dL — ABNORMAL LOW (ref 11.1–15.9)
MCH: 22.9 pg — ABNORMAL LOW (ref 26.6–33.0)
MCHC: 30.4 g/dL — ABNORMAL LOW (ref 31.5–35.7)
MCV: 76 fL — ABNORMAL LOW (ref 79–97)
Platelets: 274 10*3/uL (ref 150–450)
RBC: 4.32 x10E6/uL (ref 3.77–5.28)
RDW: 15.8 % — ABNORMAL HIGH (ref 11.7–15.4)
WBC: 5.3 10*3/uL (ref 3.4–10.8)

## 2022-02-06 LAB — BASIC METABOLIC PANEL
BUN/Creatinine Ratio: 23 (ref 9–23)
BUN: 15 mg/dL (ref 6–24)
CO2: 22 mmol/L (ref 20–29)
Calcium: 9.5 mg/dL (ref 8.7–10.2)
Chloride: 102 mmol/L (ref 96–106)
Creatinine, Ser: 0.65 mg/dL (ref 0.57–1.00)
Glucose: 89 mg/dL (ref 70–99)
Potassium: 4.1 mmol/L (ref 3.5–5.2)
Sodium: 138 mmol/L (ref 134–144)
eGFR: 114 mL/min/{1.73_m2} (ref >59)

## 2022-02-06 LAB — TSH: TSH: 0.619 u[IU]/mL (ref 0.450–4.500)

## 2022-02-06 LAB — HEMOGLOBIN A1C
Est. average glucose Bld gHb Est-mCnc: 126 mg/dL
Hgb A1c MFr Bld: 6 % — ABNORMAL HIGH (ref 4.8–5.6)

## 2022-02-06 LAB — LIPID PANEL
Chol/HDL Ratio: 2.4 ratio (ref 0.0–4.4)
Cholesterol, Total: 164 mg/dL (ref 100–199)
HDL: 67 mg/dL (ref >39)
LDL Chol Calc (NIH): 87 mg/dL (ref 0–99)
Triglycerides: 45 mg/dL (ref 0–149)
VLDL Cholesterol Cal: 10 mg/dL (ref 5–40)

## 2022-02-09 ENCOUNTER — Encounter: Payer: Self-pay | Admitting: Obstetrics and Gynecology

## 2022-03-03 ENCOUNTER — Encounter: Payer: Self-pay | Admitting: Obstetrics and Gynecology

## 2022-03-03 ENCOUNTER — Ambulatory Visit (INDEPENDENT_AMBULATORY_CARE_PROVIDER_SITE_OTHER): Payer: 59 | Admitting: Obstetrics and Gynecology

## 2022-03-03 VITALS — BP 101/68 | HR 78 | Ht 61.0 in | Wt 113.4 lb

## 2022-03-03 DIAGNOSIS — T8332XD Displacement of intrauterine contraceptive device, subsequent encounter: Secondary | ICD-10-CM

## 2022-03-03 DIAGNOSIS — Z01818 Encounter for other preprocedural examination: Secondary | ICD-10-CM | POA: Diagnosis not present

## 2022-03-03 NOTE — H&P (View-Only) (Signed)
PRE-OPERATIVE HISTORY AND PHYSICAL EXAM  PCP:  Titus Mould, NP Subjective:   HPI:  Carla Gentry is a 41 y.o. G1P1001.  No LMP recorded. (Menstrual status: IUD).  She presents today for a pre-op discussion and PE.  She has the following symptoms: IUD lost.  She desires replacement of this IUD with another Mirena. She has had an IUD in place for greater than 12 years.  She reports that many years ago the strings were lost and they tried to remove the IUD but could not find it.  They imaged her uterus and said that it was in place but they could not get it out.  I have blindly attempted to remove it in the office without success. We will plan ultrasound prior to surgery date to confirm location of IUD.  Review of Systems:   Constitutional: Denied constitutional symptoms, night sweats, recent illness, fatigue, fever, insomnia and weight loss.  Eyes: Denied eye symptoms, eye pain, photophobia, vision change and visual disturbance.  Ears/Nose/Throat/Neck: Denied ear, nose, throat or neck symptoms, hearing loss, nasal discharge, sinus congestion and sore throat.  Cardiovascular: Denied cardiovascular symptoms, arrhythmia, chest pain/pressure, edema, exercise intolerance, orthopnea and palpitations.  Respiratory: Denied pulmonary symptoms, asthma, pleuritic pain, productive sputum, cough, dyspnea and wheezing.  Gastrointestinal: Denied, gastro-esophageal reflux, melena, nausea and vomiting.  Genitourinary: See HPI for additional information.  Musculoskeletal: Denied musculoskeletal symptoms, stiffness, swelling, muscle weakness and myalgia.  Dermatologic: Denied dermatology symptoms, rash and scar.  Neurologic: Denied neurology symptoms, dizziness, headache, neck pain and syncope.  Psychiatric: Denied psychiatric symptoms, anxiety and depression.  Endocrine: Denied endocrine symptoms including hot flashes and night sweats.   OB History  Gravida Para Term Preterm AB Living   1 1 1     1   SAB IAB Ectopic Multiple Live Births          1    # Outcome Date GA Lbr Len/2nd Weight Sex Delivery Anes PTL Lv  1 Term 2009     CS-Unspec   LIV    Past Medical History:  Diagnosis Date   Anxiety    Depression    Migraine     Past Surgical History:  Procedure Laterality Date   CESAREAN SECTION     CHOLECYSTECTOMY     GASTRIC BYPASS        SOCIAL HISTORY:  Social History   Tobacco Use  Smoking Status Never  Smokeless Tobacco Never   Social History   Substance and Sexual Activity  Alcohol Use Not Currently   Comment: rarely    Social History   Substance and Sexual Activity  Drug Use Never    Family History  Problem Relation Age of Onset   Hypertension Father     ALLERGIES:  Patient has no known allergies.  MEDS:   Current Outpatient Medications on File Prior to Visit  Medication Sig Dispense Refill   levonorgestrel (MIRENA) 20 MCG/DAY IUD by Intrauterine route.     risperiDONE (RISPERDAL) 2 MG tablet TAKE 1 TABLET BY MOUTH AT BEDTIME. 90 tablet 0   zaleplon (SONATA) 10 MG capsule TAKE 1 BY MOUTH DAILY AT BEDTIME AS NEEDED AND MAY REPEAT 1 FOR MIDNOCTURNAL AWAKENING AS LONG AS SHE HAS 3 HOURS LEFT TO SLEEP 30 capsule 3   No current facility-administered medications on file prior to visit.    No orders of the defined types were placed in this encounter.    Physical examination BP 101/68  Pulse 78   Ht 5\' 1"  (1.549 m)   Wt 113 lb 6.4 oz (51.4 kg)   BMI 21.43 kg/m   General NAD, Conversant  HEENT Atraumatic; Op clear with mmm.  Normo-cephalic. Pupils reactive. Anicteric sclerae  Thyroid/Neck Smooth without nodularity or enlargement. Normal ROM.  Neck Supple.  Skin No rashes, lesions or ulceration. Normal palpated skin turgor. No nodularity.  Breasts: No masses or discharge.  Symmetric.  No axillary adenopathy.  Lungs: Clear to auscultation.No rales or wheezes. Normal Respiratory effort, no retractions.  Heart: NSR.  No murmurs  or rubs appreciated. No periferal edema  Abdomen: Soft.  Non-tender.  No masses.  No HSM. No hernia  Extremities: Moves all appropriately.  Normal ROM for age. No lymphadenopathy.  Neuro: Oriented to PPT.  Normal mood. Normal affect.     Pelvic:   Vulva: Normal appearance.  No lesions.  Vagina: No lesions or abnormalities noted.  Support: Normal pelvic support.  Urethra No masses tenderness or scarring.  Meatus Normal size without lesions or prolapse.  Cervix: Normal ectropion.  No lesions.  Anus: Normal exam.  No lesions.  Perineum: Normal exam.  No lesions.        Bimanual   Uterus: Normal size.  Non-tender.  Mobile.  AV.  Adnexae: No masses.  Non-tender to palpation.  Cul-de-sac: Negative for abnormality.   Assessment:   G1P1001 Patient Active Problem List   Diagnosis Date Noted   Opioid use with withdrawal (HCC) 03/30/2013   Bipolar I disorder, most recent episode (or current) depressed, severe, without mention of psychotic behavior 03/30/2013   Suicidal ideation 03/30/2013    1. Pre-op exam   2. Intrauterine contraceptive device threads lost, subsequent encounter    Based on history and previous imaging unavailable to me IUD should be in the uterus.   Plan:   Orders: No orders of the defined types were placed in this encounter.    1.  IUD removal under anesthesia and reinsertion of new Mirena. 2.  Possible hysteroscopy if necessary.  Pre-op discussions regarding Risks and Benefits of her scheduled surgery.  Risk benefits of the above procedure discussed in detail.  Risks include anesthesia, uterine perforation.

## 2022-03-03 NOTE — Progress Notes (Signed)
Patient presents today for Pre-Op for IUD removal. She states no additional questions or concerns.

## 2022-03-03 NOTE — H&P (Signed)
PRE-OPERATIVE HISTORY AND PHYSICAL EXAM  PCP:  Titus Mould, NP Subjective:   HPI:  Carla Gentry is a 41 y.o. G1P1001.  No LMP recorded. (Menstrual status: IUD).  She presents today for a pre-op discussion and PE.  She has the following symptoms: IUD lost.  She desires replacement of this IUD with another Mirena. She has had an IUD in place for greater than 12 years.  She reports that many years ago the strings were lost and they tried to remove the IUD but could not find it.  They imaged her uterus and said that it was in place but they could not get it out.  I have blindly attempted to remove it in the office without success. We will plan ultrasound prior to surgery date to confirm location of IUD.  Review of Systems:   Constitutional: Denied constitutional symptoms, night sweats, recent illness, fatigue, fever, insomnia and weight loss.  Eyes: Denied eye symptoms, eye pain, photophobia, vision change and visual disturbance.  Ears/Nose/Throat/Neck: Denied ear, nose, throat or neck symptoms, hearing loss, nasal discharge, sinus congestion and sore throat.  Cardiovascular: Denied cardiovascular symptoms, arrhythmia, chest pain/pressure, edema, exercise intolerance, orthopnea and palpitations.  Respiratory: Denied pulmonary symptoms, asthma, pleuritic pain, productive sputum, cough, dyspnea and wheezing.  Gastrointestinal: Denied, gastro-esophageal reflux, melena, nausea and vomiting.  Genitourinary: See HPI for additional information.  Musculoskeletal: Denied musculoskeletal symptoms, stiffness, swelling, muscle weakness and myalgia.  Dermatologic: Denied dermatology symptoms, rash and scar.  Neurologic: Denied neurology symptoms, dizziness, headache, neck pain and syncope.  Psychiatric: Denied psychiatric symptoms, anxiety and depression.  Endocrine: Denied endocrine symptoms including hot flashes and night sweats.   OB History  Gravida Para Term Preterm AB Living   1 1 1     1   SAB IAB Ectopic Multiple Live Births          1    # Outcome Date GA Lbr Len/2nd Weight Sex Delivery Anes PTL Lv  1 Term 2009     CS-Unspec   LIV    Past Medical History:  Diagnosis Date   Anxiety    Depression    Migraine     Past Surgical History:  Procedure Laterality Date   CESAREAN SECTION     CHOLECYSTECTOMY     GASTRIC BYPASS        SOCIAL HISTORY:  Social History   Tobacco Use  Smoking Status Never  Smokeless Tobacco Never   Social History   Substance and Sexual Activity  Alcohol Use Not Currently   Comment: rarely    Social History   Substance and Sexual Activity  Drug Use Never    Family History  Problem Relation Age of Onset   Hypertension Father     ALLERGIES:  Patient has no known allergies.  MEDS:   Current Outpatient Medications on File Prior to Visit  Medication Sig Dispense Refill   levonorgestrel (MIRENA) 20 MCG/DAY IUD by Intrauterine route.     risperiDONE (RISPERDAL) 2 MG tablet TAKE 1 TABLET BY MOUTH AT BEDTIME. 90 tablet 0   zaleplon (SONATA) 10 MG capsule TAKE 1 BY MOUTH DAILY AT BEDTIME AS NEEDED AND MAY REPEAT 1 FOR MIDNOCTURNAL AWAKENING AS LONG AS SHE HAS 3 HOURS LEFT TO SLEEP 30 capsule 3   No current facility-administered medications on file prior to visit.    No orders of the defined types were placed in this encounter.    Physical examination BP 101/68  Pulse 78   Ht 5' 1" (1.549 m)   Wt 113 lb 6.4 oz (51.4 kg)   BMI 21.43 kg/m   General NAD, Conversant  HEENT Atraumatic; Op clear with mmm.  Normo-cephalic. Pupils reactive. Anicteric sclerae  Thyroid/Neck Smooth without nodularity or enlargement. Normal ROM.  Neck Supple.  Skin No rashes, lesions or ulceration. Normal palpated skin turgor. No nodularity.  Breasts: No masses or discharge.  Symmetric.  No axillary adenopathy.  Lungs: Clear to auscultation.No rales or wheezes. Normal Respiratory effort, no retractions.  Heart: NSR.  No murmurs  or rubs appreciated. No periferal edema  Abdomen: Soft.  Non-tender.  No masses.  No HSM. No hernia  Extremities: Moves all appropriately.  Normal ROM for age. No lymphadenopathy.  Neuro: Oriented to PPT.  Normal mood. Normal affect.     Pelvic:   Vulva: Normal appearance.  No lesions.  Vagina: No lesions or abnormalities noted.  Support: Normal pelvic support.  Urethra No masses tenderness or scarring.  Meatus Normal size without lesions or prolapse.  Cervix: Normal ectropion.  No lesions.  Anus: Normal exam.  No lesions.  Perineum: Normal exam.  No lesions.        Bimanual   Uterus: Normal size.  Non-tender.  Mobile.  AV.  Adnexae: No masses.  Non-tender to palpation.  Cul-de-sac: Negative for abnormality.   Assessment:   G1P1001 Patient Active Problem List   Diagnosis Date Noted   Opioid use with withdrawal (HCC) 03/30/2013   Bipolar I disorder, most recent episode (or current) depressed, severe, without mention of psychotic behavior 03/30/2013   Suicidal ideation 03/30/2013    1. Pre-op exam   2. Intrauterine contraceptive device threads lost, subsequent encounter    Based on history and previous imaging unavailable to me IUD should be in the uterus.   Plan:   Orders: No orders of the defined types were placed in this encounter.    1.  IUD removal under anesthesia and reinsertion of new Mirena. 2.  Possible hysteroscopy if necessary.  Pre-op discussions regarding Risks and Benefits of her scheduled surgery.  Risk benefits of the above procedure discussed in detail.  Risks include anesthesia, uterine perforation.  

## 2022-03-03 NOTE — Progress Notes (Signed)
PRE-OPERATIVE HISTORY AND PHYSICAL EXAM  PCP:  Titus Mould, NP Subjective:   HPI:  Carla Gentry is a 41 y.o. G1P1001.  No LMP recorded. (Menstrual status: IUD).  She presents today for a pre-op discussion and PE.  She has the following symptoms: IUD lost.  She desires replacement of this IUD with another Mirena. She has had an IUD in place for greater than 12 years.  She reports that many years ago the strings were lost and they tried to remove the IUD but could not find it.  They imaged her uterus and said that it was in place but they could not get it out.  I have blindly attempted to remove it in the office without success. We will plan ultrasound prior to surgery date to confirm location of IUD.  Review of Systems:   Constitutional: Denied constitutional symptoms, night sweats, recent illness, fatigue, fever, insomnia and weight loss.  Eyes: Denied eye symptoms, eye pain, photophobia, vision change and visual disturbance.  Ears/Nose/Throat/Neck: Denied ear, nose, throat or neck symptoms, hearing loss, nasal discharge, sinus congestion and sore throat.  Cardiovascular: Denied cardiovascular symptoms, arrhythmia, chest pain/pressure, edema, exercise intolerance, orthopnea and palpitations.  Respiratory: Denied pulmonary symptoms, asthma, pleuritic pain, productive sputum, cough, dyspnea and wheezing.  Gastrointestinal: Denied, gastro-esophageal reflux, melena, nausea and vomiting.  Genitourinary: See HPI for additional information.  Musculoskeletal: Denied musculoskeletal symptoms, stiffness, swelling, muscle weakness and myalgia.  Dermatologic: Denied dermatology symptoms, rash and scar.  Neurologic: Denied neurology symptoms, dizziness, headache, neck pain and syncope.  Psychiatric: Denied psychiatric symptoms, anxiety and depression.  Endocrine: Denied endocrine symptoms including hot flashes and night sweats.   OB History  Gravida Para Term Preterm AB Living   1 1 1     1   SAB IAB Ectopic Multiple Live Births          1    # Outcome Date GA Lbr Len/2nd Weight Sex Delivery Anes PTL Lv  1 Term 2009     CS-Unspec   LIV    Past Medical History:  Diagnosis Date   Anxiety    Depression    Migraine     Past Surgical History:  Procedure Laterality Date   CESAREAN SECTION     CHOLECYSTECTOMY     GASTRIC BYPASS        SOCIAL HISTORY:  Social History   Tobacco Use  Smoking Status Never  Smokeless Tobacco Never   Social History   Substance and Sexual Activity  Alcohol Use Not Currently   Comment: rarely    Social History   Substance and Sexual Activity  Drug Use Never    Family History  Problem Relation Age of Onset   Hypertension Father     ALLERGIES:  Patient has no known allergies.  MEDS:   Current Outpatient Medications on File Prior to Visit  Medication Sig Dispense Refill   levonorgestrel (MIRENA) 20 MCG/DAY IUD by Intrauterine route.     risperiDONE (RISPERDAL) 2 MG tablet TAKE 1 TABLET BY MOUTH AT BEDTIME. 90 tablet 0   zaleplon (SONATA) 10 MG capsule TAKE 1 BY MOUTH DAILY AT BEDTIME AS NEEDED AND MAY REPEAT 1 FOR MIDNOCTURNAL AWAKENING AS LONG AS SHE HAS 3 HOURS LEFT TO SLEEP 30 capsule 3   No current facility-administered medications on file prior to visit.    No orders of the defined types were placed in this encounter.    Physical examination BP 101/68  Pulse 78   Ht 5\' 1"  (1.549 m)   Wt 113 lb 6.4 oz (51.4 kg)   BMI 21.43 kg/m   General NAD, Conversant  HEENT Atraumatic; Op clear with mmm.  Normo-cephalic. Pupils reactive. Anicteric sclerae  Thyroid/Neck Smooth without nodularity or enlargement. Normal ROM.  Neck Supple.  Skin No rashes, lesions or ulceration. Normal palpated skin turgor. No nodularity.  Breasts: No masses or discharge.  Symmetric.  No axillary adenopathy.  Lungs: Clear to auscultation.No rales or wheezes. Normal Respiratory effort, no retractions.  Heart: NSR.  No murmurs  or rubs appreciated. No periferal edema  Abdomen: Soft.  Non-tender.  No masses.  No HSM. No hernia  Extremities: Moves all appropriately.  Normal ROM for age. No lymphadenopathy.  Neuro: Oriented to PPT.  Normal mood. Normal affect.     Pelvic:   Vulva: Normal appearance.  No lesions.  Vagina: No lesions or abnormalities noted.  Support: Normal pelvic support.  Urethra No masses tenderness or scarring.  Meatus Normal size without lesions or prolapse.  Cervix: Normal ectropion.  No lesions.  Anus: Normal exam.  No lesions.  Perineum: Normal exam.  No lesions.        Bimanual   Uterus: Normal size.  Non-tender.  Mobile.  AV.  Adnexae: No masses.  Non-tender to palpation.  Cul-de-sac: Negative for abnormality.   Assessment:   G1P1001 Patient Active Problem List   Diagnosis Date Noted   Opioid use with withdrawal (HCC) 03/30/2013   Bipolar I disorder, most recent episode (or current) depressed, severe, without mention of psychotic behavior 03/30/2013   Suicidal ideation 03/30/2013    1. Pre-op exam   2. Intrauterine contraceptive device threads lost, subsequent encounter    Based on history and previous imaging unavailable to me IUD should be in the uterus.   Plan:   Orders: No orders of the defined types were placed in this encounter.    1.  IUD removal under anesthesia and reinsertion of new Mirena. 2.  Possible hysteroscopy if necessary.  Pre-op discussions regarding Risks and Benefits of her scheduled surgery.  Risk benefits of the above procedure discussed in detail.  Risks include anesthesia, uterine perforation.  05/30/2013, M.D. 03/03/2022 3:52 PM

## 2022-03-16 ENCOUNTER — Other Ambulatory Visit: Payer: 59

## 2022-03-18 ENCOUNTER — Ambulatory Visit (INDEPENDENT_AMBULATORY_CARE_PROVIDER_SITE_OTHER): Payer: 59

## 2022-03-18 DIAGNOSIS — Z30433 Encounter for removal and reinsertion of intrauterine contraceptive device: Secondary | ICD-10-CM

## 2022-03-20 ENCOUNTER — Encounter
Admission: RE | Admit: 2022-03-20 | Discharge: 2022-03-20 | Disposition: A | Discharge status: Home / Self Care | Payer: 59 | Source: Ambulatory Visit | Attending: Obstetrics and Gynecology | Admitting: Obstetrics and Gynecology

## 2022-03-20 ENCOUNTER — Other Ambulatory Visit: Payer: Self-pay

## 2022-03-20 DIAGNOSIS — Z01812 Encounter for preprocedural laboratory examination: Secondary | ICD-10-CM

## 2022-03-20 HISTORY — DX: Bipolar disorder, unspecified: F31.9

## 2022-03-20 HISTORY — DX: Anemia, unspecified: D64.9

## 2022-03-20 HISTORY — DX: Prediabetes: R73.03

## 2022-03-20 NOTE — Patient Instructions (Addendum)
Your procedure is scheduled on: 03/30/22 - Monday Report to the Registration Desk on the 1st floor of the [facility]. To find out your arrival time, please call [phone] between 1PM - 3PM on: 03/27/22 - Friday If your arrival time is 6:00 am, do not arrive prior to that time as the [facility] entrance doors do not open until 6:00 am.  REMEMBER: Instructions that are not followed completely may result in serious medical risk, up to and including death; or upon the discretion of your surgeon and anesthesiologist your surgery may need to be rescheduled.  Do not eat food or drink any fluids after midnight the night before surgery.  No gum chewing, lozengers or hard candies.  TAKE THESE MEDICATIONS THE MORNING OF SURGERY WITH A SIP OF WATER: NONE  One week prior to surgery: Stop Anti-inflammatories (NSAIDS) such as Advil, Aleve, Ibuprofen, Motrin, Naproxen, Naprosyn and Aspirin based products such as Excedrin, Goodys Powder, BC Powder.  Stop ANY OVER THE COUNTER supplements until after surgery.  You may however, continue to take Tylenol if needed for pain up until the day of surgery.  No Alcohol for 24 hours before or after surgery.  No Smoking including e-cigarettes for 24 hours prior to surgery.  No chewable tobacco products for at least 6 hours prior to surgery.  No nicotine patches on the day of surgery.  Do not use any "recreational" drugs for at least a week prior to your surgery.  Please be advised that the combination of cocaine and anesthesia may have negative outcomes, up to and including death. If you test positive for cocaine, your surgery will be cancelled.  On the morning of surgery brush your teeth with toothpaste and water, you may rinse your mouth with mouthwash if you wish. Do not swallow any toothpaste or mouthwash.  Do not wear jewelry, make-up, hairpins, clips or nail polish.  Do not wear lotions, powders, or perfumes.   Do not shave body from the neck  down 48 hours prior to surgery just in case you cut yourself which could leave a site for infection.  Also, freshly shaved skin may become irritated if using the CHG soap.  Contact lenses, hearing aids and dentures may not be worn into surgery.  Do not bring valuables to the hospital. [facility] is not responsible for any missing/lost belongings or valuables.   Notify your doctor if there is any change in your medical condition (cold, fever, infection).  Wear comfortable clothing (specific to your surgery type) to the hospital.  After surgery, you can help prevent lung complications by doing breathing exercises.  Take deep breaths and cough every 1-2 hours. Your doctor may order a device called an Incentive Spirometer to help you take deep breaths. When coughing or sneezing, hold a pillow firmly against your incision with both hands. This is called "splinting." Doing this helps protect your incision. It also decreases belly discomfort.  If you are being admitted to the hospital overnight, leave your suitcase in the car. After surgery it may be brought to your room.  If you are being discharged the day of surgery, you will not be allowed to drive home. You will need a responsible adult (18 years or older) to drive you home and stay with you that night.   If you are taking public transportation, you will need to have a responsible adult (18 years or older) with you. Please confirm with your physician that it is acceptable to use public transportation.  Please call the [facility] Dept. at [phone] if you have any questions about these instructions.  Surgery Visitation Policy:  Patients undergoing a surgery or procedure may have two family members or support persons with them as long as the person is not COVID-19 positive or experiencing its symptoms.   Inpatient Visitation:    Visiting hours are 7 a.m. to 8 p.m. Up to four visitors are allowed at one time in a  patient room, including children. The visitors may rotate out with other people during the day. One designated support person (adult) may remain overnight.

## 2022-03-22 ENCOUNTER — Other Ambulatory Visit: Payer: Self-pay | Admitting: Physician Assistant

## 2022-03-23 NOTE — Telephone Encounter (Signed)
Filled 8/27

## 2022-03-29 MED ORDER — FAMOTIDINE 20 MG PO TABS: 20.0000 mg | ORAL_TABLET | Freq: Once | ORAL | Status: AC

## 2022-03-29 MED ORDER — CHLORHEXIDINE GLUCONATE 0.12 % MT SOLN: 15.0000 mL | Freq: Once | OROMUCOSAL | Status: AC

## 2022-03-29 MED ORDER — POVIDONE-IODINE 10 % EX SWAB: 2.0000 | Freq: Once | CUTANEOUS | Status: DC

## 2022-03-29 MED ORDER — ORAL CARE MOUTH RINSE: 15.0000 mL | Freq: Once | OROMUCOSAL | Status: AC

## 2022-03-29 MED ORDER — LACTATED RINGERS IV SOLN: INTRAVENOUS | Status: DC

## 2022-03-30 ENCOUNTER — Encounter
Admission: RE | Disposition: A | Discharge status: Home / Self Care | Payer: Self-pay | Source: Home / Self Care | Attending: Obstetrics and Gynecology

## 2022-03-30 ENCOUNTER — Other Ambulatory Visit: Payer: Self-pay

## 2022-03-30 ENCOUNTER — Encounter: Payer: Self-pay | Admitting: Obstetrics and Gynecology

## 2022-03-30 ENCOUNTER — Ambulatory Visit
Admission: RE | Admit: 2022-03-30 | Discharge: 2022-03-30 | Disposition: A | Discharge status: Home / Self Care | Payer: 59 | Attending: Obstetrics and Gynecology | Admitting: Obstetrics and Gynecology

## 2022-03-30 ENCOUNTER — Ambulatory Visit: Payer: 59 | Admitting: Anesthesiology

## 2022-03-30 DIAGNOSIS — Z30433 Encounter for removal and reinsertion of intrauterine contraceptive device: Secondary | ICD-10-CM | POA: Diagnosis not present

## 2022-03-30 DIAGNOSIS — T8332XD Displacement of intrauterine contraceptive device, subsequent encounter: Secondary | ICD-10-CM

## 2022-03-30 DIAGNOSIS — Z01812 Encounter for preprocedural laboratory examination: Secondary | ICD-10-CM

## 2022-03-30 DIAGNOSIS — T8332XA Displacement of intrauterine contraceptive device, initial encounter: Secondary | ICD-10-CM | POA: Diagnosis present

## 2022-03-30 DIAGNOSIS — X58XXXA Exposure to other specified factors, initial encounter: Secondary | ICD-10-CM | POA: Diagnosis not present

## 2022-03-30 DIAGNOSIS — T8339XD Other mechanical complication of intrauterine contraceptive device, subsequent encounter: Secondary | ICD-10-CM | POA: Diagnosis not present

## 2022-03-30 HISTORY — PX: IUD REMOVAL: SHX5392

## 2022-03-30 HISTORY — PX: HYSTEROSCOPY: SHX211

## 2022-03-30 HISTORY — PX: INTRAUTERINE DEVICE (IUD) INSERTION: SHX5877

## 2022-03-30 LAB — POCT PREGNANCY, URINE: Preg Test, Ur: NEGATIVE

## 2022-03-30 SURGERY — REMOVAL, INTRAUTERINE DEVICE
Anesthesia: General

## 2022-03-30 MED ORDER — FENTANYL CITRATE (PF) 100 MCG/2ML IJ SOLN: 25.0000 ug | INTRAMUSCULAR | Status: DC | PRN

## 2022-03-30 MED ORDER — PROPOFOL 10 MG/ML IV BOLUS
INTRAVENOUS | Status: AC
  Filled 2022-03-30: qty 20

## 2022-03-30 MED ORDER — FENTANYL CITRATE (PF) 100 MCG/2ML IJ SOLN
INTRAMUSCULAR | Status: AC
  Filled 2022-03-30: qty 2

## 2022-03-30 MED ORDER — MIDAZOLAM HCL 2 MG/2ML IJ SOLN
INTRAMUSCULAR | Status: DC | PRN
  Administered 2022-03-30 (×2): 1 mg via INTRAVENOUS

## 2022-03-30 MED ORDER — FENTANYL CITRATE (PF) 100 MCG/2ML IJ SOLN
INTRAMUSCULAR | Status: DC | PRN
  Administered 2022-03-30 (×4): 25 ug via INTRAVENOUS

## 2022-03-30 MED ORDER — FAMOTIDINE 20 MG PO TABS
ORAL_TABLET | ORAL | Status: AC
  Administered 2022-03-30: 20 mg via ORAL
  Filled 2022-03-30: qty 1

## 2022-03-30 MED ORDER — DEXAMETHASONE SODIUM PHOSPHATE 10 MG/ML IJ SOLN
INTRAMUSCULAR | Status: DC | PRN
  Administered 2022-03-30: 10 mg via INTRAVENOUS

## 2022-03-30 MED ORDER — PROPOFOL 500 MG/50ML IV EMUL
INTRAVENOUS | Status: DC | PRN
  Administered 2022-03-30: 100 ug/kg/min via INTRAVENOUS

## 2022-03-30 MED ORDER — CHLORHEXIDINE GLUCONATE 0.12 % MT SOLN
OROMUCOSAL | Status: AC
  Administered 2022-03-30: 15 mL via OROMUCOSAL
  Filled 2022-03-30: qty 15

## 2022-03-30 MED ORDER — LIDOCAINE HCL (PF) 2 % IJ SOLN
INTRAMUSCULAR | Status: AC
  Filled 2022-03-30: qty 5

## 2022-03-30 MED ORDER — LIDOCAINE HCL (CARDIAC) PF 100 MG/5ML IV SOSY
PREFILLED_SYRINGE | INTRAVENOUS | Status: DC | PRN
  Administered 2022-03-30: 50 mg via INTRAVENOUS

## 2022-03-30 MED ORDER — LEVONORGESTREL 20 MCG/DAY IU IUD: 1.0000 | INTRAUTERINE_SYSTEM | Freq: Once | INTRAUTERINE | Status: DC

## 2022-03-30 MED ORDER — OXYCODONE HCL 5 MG PO TABS
5.0000 mg | ORAL_TABLET | Freq: Once | ORAL | Status: AC | PRN
  Administered 2022-03-30: 5 mg via ORAL

## 2022-03-30 MED ORDER — LEVONORGESTREL 20 MCG/DAY IU IUD
INTRAUTERINE_SYSTEM | INTRAUTERINE | Status: AC
  Filled 2022-03-30: qty 1

## 2022-03-30 MED ORDER — GLYCOPYRROLATE 0.2 MG/ML IJ SOLN
INTRAMUSCULAR | Status: AC
  Filled 2022-03-30: qty 1

## 2022-03-30 MED ORDER — DEXAMETHASONE SODIUM PHOSPHATE 10 MG/ML IJ SOLN
INTRAMUSCULAR | Status: AC
  Filled 2022-03-30: qty 1

## 2022-03-30 MED ORDER — 0.9 % SODIUM CHLORIDE (POUR BTL) OPTIME
TOPICAL | Status: DC | PRN
  Administered 2022-03-30: 500 mL

## 2022-03-30 MED ORDER — KETOROLAC TROMETHAMINE 30 MG/ML IJ SOLN
INTRAMUSCULAR | Status: DC | PRN
  Administered 2022-03-30: 30 mg via INTRAVENOUS

## 2022-03-30 MED ORDER — ONDANSETRON HCL 4 MG/2ML IJ SOLN
INTRAMUSCULAR | Status: AC
  Filled 2022-03-30: qty 2

## 2022-03-30 MED ORDER — OXYCODONE HCL 5 MG/5ML PO SOLN: 5.0000 mg | Freq: Once | ORAL | Status: AC | PRN

## 2022-03-30 MED ORDER — ACETAMINOPHEN 10 MG/ML IV SOLN: 1000.0000 mg | Freq: Once | INTRAVENOUS | Status: DC | PRN

## 2022-03-30 MED ORDER — OXYCODONE HCL 5 MG PO TABS
ORAL_TABLET | ORAL | Status: AC
  Filled 2022-03-30: qty 1

## 2022-03-30 MED ORDER — DROPERIDOL 2.5 MG/ML IJ SOLN: 0.6250 mg | Freq: Once | INTRAMUSCULAR | Status: DC | PRN

## 2022-03-30 MED ORDER — KETOROLAC TROMETHAMINE 15 MG/ML IJ SOLN: 15.0000 mg | Freq: Once | INTRAMUSCULAR | Status: DC | PRN

## 2022-03-30 MED ORDER — PROMETHAZINE HCL 25 MG/ML IJ SOLN: 6.2500 mg | INTRAMUSCULAR | Status: DC | PRN

## 2022-03-30 MED ORDER — MIDAZOLAM HCL 2 MG/2ML IJ SOLN
INTRAMUSCULAR | Status: AC
  Filled 2022-03-30: qty 2

## 2022-03-30 MED ORDER — KETOROLAC TROMETHAMINE 30 MG/ML IJ SOLN
INTRAMUSCULAR | Status: AC
  Filled 2022-03-30: qty 1

## 2022-03-30 MED ORDER — PROPOFOL 10 MG/ML IV BOLUS
INTRAVENOUS | Status: DC | PRN
  Administered 2022-03-30 (×3): 20 mg via INTRAVENOUS
  Administered 2022-03-30: 80 mg via INTRAVENOUS
  Administered 2022-03-30 (×2): 20 mg via INTRAVENOUS
  Administered 2022-03-30: 40 mg via INTRAVENOUS
  Administered 2022-03-30: 20 mg via INTRAVENOUS

## 2022-03-30 MED ORDER — ONDANSETRON HCL 4 MG/2ML IJ SOLN
INTRAMUSCULAR | Status: DC | PRN
  Administered 2022-03-30: 4 mg via INTRAVENOUS

## 2022-03-30 SURGICAL SUPPLY — 24 items
BAG PRESSURE INF DISP 1000 (BAG) IMPLANT
BAG PRSS INFS OVL BLB 1000 (BAG) ×1
CUP MEDICINE 2OZ PLAST GRAD ST (MISCELLANEOUS) ×1 IMPLANT
DRAPE UNDER BUTTOCK W/FLU (DRAPES) ×1 IMPLANT
DRSG TELFA 3X8 NADH STRL (GAUZE/BANDAGES/DRESSINGS) ×1 IMPLANT
GLOVE PI ORTHO PRO STRL 7.5 (GLOVE) ×1 IMPLANT
GLOVE SURG UNDER LTX SZ7 (GLOVE) ×1 IMPLANT
GOWN STRL REUS W/ TWL LRG LVL3 (GOWN DISPOSABLE) ×1 IMPLANT
GOWN STRL REUS W/TWL LRG LVL3 (GOWN DISPOSABLE) ×1
KIT TURNOVER CYSTO (KITS) ×1 IMPLANT
MANIFOLD NEPTUNE II (INSTRUMENTS) ×1 IMPLANT
Mirena IUD IMPLANT
PACK DNC HYST (MISCELLANEOUS) ×1 IMPLANT
PAD OB MATERNITY 4.3X12.25 (PERSONAL CARE ITEMS) ×1 IMPLANT
PAD PREP 24X41 OB/GYN DISP (PERSONAL CARE ITEMS) ×1 IMPLANT
SCRUB CHG 4% DYNA-HEX 4OZ (MISCELLANEOUS) ×1 IMPLANT
SET CYSTO W/LG BORE CLAMP LF (SET/KITS/TRAYS/PACK) IMPLANT
SOL PREP PVP 2OZ (MISCELLANEOUS) ×1
SOLUTION PREP PVP 2OZ (MISCELLANEOUS) ×1 IMPLANT
SYS MIRENA INTRAUTERINE (MISCELLANEOUS) ×1
SYSTEM MIRENA INTRAUTERINE (MISCELLANEOUS) IMPLANT
TOWEL OR 17X26 4PK STRL BLUE (TOWEL DISPOSABLE) ×1 IMPLANT
TRAP FLUID SMOKE EVACUATOR (MISCELLANEOUS) ×1 IMPLANT
WATER STERILE IRR 500ML POUR (IV SOLUTION) ×1 IMPLANT

## 2022-03-30 NOTE — Interval H&P Note (Signed)
History and Physical Interval Note:  03/30/2022 10:51 AM  Carla Gentry  has presented today for surgery, with the diagnosis of lost IUD.  The various methods of treatment have been discussed with the patient and family. After consideration of risks, benefits and other options for treatment, the patient has consented to  Procedure(s): INTRAUTERINE DEVICE (IUD) REMOVAL (N/A) INTRAUTERINE DEVICE (IUD) INSERTION (N/A) HYSTEROSCOPY (N/A) as a surgical intervention.  The patient's history has been reviewed, patient examined, no change in status, stable for surgery.  I have reviewed the patient's chart and labs.  Questions were answered to the patient's satisfaction.     Jeannie Fend

## 2022-03-30 NOTE — Anesthesia Preprocedure Evaluation (Addendum)
Anesthesia Evaluation  Patient identified by MRN, date of birth, ID band Patient awake    Reviewed: Allergy & Precautions, NPO status , Patient's Chart, lab work & pertinent test results  Airway Mallampati: III  TM Distance: >3 FB Neck ROM: full    Dental no notable dental hx.    Pulmonary neg pulmonary ROS,    Pulmonary exam normal        Cardiovascular negative cardio ROS Normal cardiovascular exam     Neuro/Psych PSYCHIATRIC DISORDERS Anxiety Bipolar Disorder negative neurological ROS     GI/Hepatic negative GI ROS, Neg liver ROS,   Endo/Other  negative endocrine ROS  Renal/GU      Musculoskeletal   Abdominal Normal abdominal exam  (+)   Peds  Hematology  (+) Blood dyscrasia, anemia ,   Anesthesia Other Findings Past Medical History: No date: Anemia No date: Anxiety No date: Bipolar disorder ([place]) No date: Depression No date: Migraine No date: Pre-diabetes  Past Surgical History: No date: CESAREAN SECTION No date: CHOLECYSTECTOMY No date: GASTRIC BYPASS No date: OCCIPITAL NEURECTOMY No date: WISDOM TOOTH EXTRACTION     Reproductive/Obstetrics negative OB ROS                           Anesthesia Physical Anesthesia Plan  ASA: 2  Anesthesia Plan: General   Post-op Pain Management: Minimal or no pain anticipated   Induction: Intravenous  PONV Risk Score and Plan: Ondansetron, Midazolam, Propofol infusion and TIVA  Airway Management Planned: Natural Airway  Additional Equipment:   Intra-op Plan:   Post-operative Plan:   Informed Consent: I have reviewed the patients History and Physical, chart, labs and discussed the procedure including the risks, benefits and alternatives for the proposed anesthesia with the patient or authorized representative who has indicated his/her understanding and acceptance.     Dental Advisory Given  Plan Discussed with:  Anesthesiologist, CRNA and Surgeon  Anesthesia Plan Comments:      Anesthesia Quick Evaluation

## 2022-03-30 NOTE — Transfer of Care (Signed)
Immediate Anesthesia Transfer of Care Note  Patient: Carla Gentry  Procedure(s) Performed: INTRAUTERINE DEVICE (IUD) REMOVAL INTRAUTERINE DEVICE (IUD) INSERTION HYSTEROSCOPY  Patient Location: PACU  Anesthesia Type:General  Level of Consciousness: awake  Airway & Oxygen Therapy: Patient Spontanous Breathing  Post-op Assessment: Report given to RN and Post -op Vital signs reviewed and stable  Post vital signs: Reviewed and stable  Last Vitals:  Vitals Value Taken Time  BP 116/63 03/30/22 1145  Temp    Pulse 74 03/30/22 1145  Resp 13 03/30/22 1145  SpO2 96 % 03/30/22 1145  Vitals shown include unvalidated device data.  Last Pain:  Vitals:   03/30/22 1002  TempSrc: Temporal  PainSc: 0-No pain         Complications: No notable events documented.

## 2022-03-30 NOTE — Anesthesia Postprocedure Evaluation (Signed)
Anesthesia Post Note  Patient: Carla Gentry  Procedure(s) Performed: INTRAUTERINE DEVICE (IUD) REMOVAL INTRAUTERINE DEVICE (IUD) INSERTION HYSTEROSCOPY  Patient location during evaluation: PACU Anesthesia Type: General Level of consciousness: awake and alert Pain management: pain level controlled Vital Signs Assessment: post-procedure vital signs reviewed and stable Respiratory status: spontaneous breathing, nonlabored ventilation and respiratory function stable Cardiovascular status: blood pressure returned to baseline and stable Postop Assessment: no apparent nausea or vomiting Anesthetic complications: no   No notable events documented.   Last Vitals:  Vitals:   03/30/22 1215 03/30/22 1225  BP: 115/65 (!) 111/53  Pulse: (!) 59 (!) 58  Resp: (!) 9 16  Temp:    SpO2: 97% 100%    Last Pain:  Vitals:   03/30/22 1225  TempSrc:   PainSc: [place]

## 2022-03-30 NOTE — Discharge Instructions (Signed)
AMBULATORY SURGERY  ?DISCHARGE INSTRUCTIONS ? ? ?The drugs that you were given will stay in your system until tomorrow so for the next 24 hours you should not: ? ?Drive an automobile ?Make any legal decisions ?Drink any alcoholic beverage ? ? ?You may resume regular meals tomorrow.  Today it is better to start with liquids and gradually work up to solid foods. ? ?You may eat anything you prefer, but it is better to start with liquids, then soup and crackers, and gradually work up to solid foods. ? ? ?Please notify your doctor immediately if you have any unusual bleeding, trouble breathing, redness and pain at the surgery site, drainage, fever, or pain not relieved by medication. ? ? ? ?Additional Instructions: ? ? ? ?Please contact your physician with any problems or Same Day Surgery at [phone], Monday through Friday 6 am to 4 pm, or [facility] Main number at [phone].  ?

## 2022-03-30 NOTE — Op Note (Signed)
   OPERATIVE NOTE 03/30/2022 11:41 AM  PRE-OPERATIVE DIAGNOSIS:  1) lost IUD  POST-OPERATIVE DIAGNOSIS:  1)  IUD Removed and Mirena replaced  OPERATION: Hysteroscopy IUD removal with replacement Mirena IUD placed  SURGEON(S): Surgeon(s) and Role:    Harlin Heys, MD - Primary   ANESTHESIA: General  ESTIMATED BLOOD LOSS: 5 mL  OPERATIVE FINDINGS: IUD found in the uterus by hysteroscopy.  No strings attached.  SPECIMEN: * No specimens in log *  COMPLICATIONS: None  DRAINS: None  DISPOSITION: Stable to recovery room  DESCRIPTION OF PROCEDURE:      The patient was prepped and draped in the dorsal lithotomy position and placed under general anesthesia. Her cervix was grasped with a Jacob's tenaculum. Respecting the position and curvature of her cervix, it was dilated to accommodate a hysteroscope.  The endometrial cavity was explored using the hysteroscope and the base of the IUD was noted.  It was grasped with a grasper through the hysteroscope and removed without problem.  No strings were noted to be attached to the IUD.  We explored the entire endometrial cavity and noted no other abnormalities or no IUD strings.  Respecting the position and curvature of the uterus a Mirena IUD was placed in the usual manner.  The strings were appropriately cut.  The tenaculum was removed from the cervix and hemostasis was noted. The weighted speculum was removed and the patient went to recovery room in stable condition.    Finis Bud, M.D. 03/30/2022 11:41 AM

## 2022-03-31 ENCOUNTER — Encounter: Payer: Self-pay | Admitting: Obstetrics and Gynecology

## 2022-04-02 ENCOUNTER — Ambulatory Visit
Admission: RE | Admit: 2022-04-02 | Discharge: 2022-04-02 | Disposition: A | Discharge status: Home / Self Care | Payer: 59 | Source: Ambulatory Visit | Attending: Obstetrics and Gynecology | Admitting: Obstetrics and Gynecology

## 2022-04-02 DIAGNOSIS — Z01419 Encounter for gynecological examination (general) (routine) without abnormal findings: Secondary | ICD-10-CM | POA: Diagnosis present

## 2022-04-02 DIAGNOSIS — Z1231 Encounter for screening mammogram for malignant neoplasm of breast: Secondary | ICD-10-CM | POA: Insufficient documentation

## 2022-04-28 ENCOUNTER — Other Ambulatory Visit: Payer: Self-pay | Admitting: Physician Assistant

## 2022-08-04 ENCOUNTER — Ambulatory Visit: Payer: 59 | Admitting: Physician Assistant

## 2022-08-04 ENCOUNTER — Encounter: Payer: Self-pay | Admitting: Physician Assistant

## 2022-08-04 DIAGNOSIS — G4700 Insomnia, unspecified: Secondary | ICD-10-CM | POA: Diagnosis not present

## 2022-08-04 DIAGNOSIS — F319 Bipolar disorder, unspecified: Secondary | ICD-10-CM | POA: Diagnosis not present

## 2022-08-04 MED ORDER — ZALEPLON 10 MG PO CAPS: 10.0000 mg | ORAL_CAPSULE | Freq: Every evening | ORAL | 5 refills | Status: DC | PRN

## 2022-08-04 MED ORDER — LURASIDONE HCL 40 MG PO TABS: 40.0000 mg | ORAL_TABLET | Freq: Every day | ORAL | 1 refills | Status: DC

## 2022-08-04 NOTE — Progress Notes (Signed)
[facility] Med Check  Patient ID: Carla Gentry,  MRN: [id number]  PCP: Carla Jericho, NP  Date of Evaluation:08/04/2022 Time spent:30 minutes  Chief Complaint:  Chief Complaint   Follow-up    HISTORY/CURRENT STATUS: For routine med check.  Her FIL died recently and she wasn't able to cry. Was sad, but just can't show emotion.  Her husband states it has been that way for a long time.  She feels kind of flat.  If it was not for that problem she would be doing well.  Denies anxiety.  Patient is able to enjoy things.  Energy and motivation are good.  Work is going well.   No extreme sadness, tearfulness, or feelings of hopelessness.  Sleeps well most of the time.  She does have to use the Sonata every night.  It is still helpful.  Her insurance will not allow her to take a second dose if needed.  ADLs and personal hygiene are normal.   Denies any changes in concentration, making decisions, or remembering things.  Appetite has not changed.  Weight is stable.   Denies suicidal or homicidal thoughts.  Patient denies increased energy with decreased need for sleep, increased talkativeness, racing thoughts, impulsivity or risky behaviors, increased spending, increased libido, grandiosity, increased irritability or anger, paranoia, or hallucinations.  Denies dizziness, syncope, seizures, numbness, tingling, tremor, tics, unsteady gait, slurred speech, confusion. Denies muscle or joint pain, stiffness, or dystonia. Denies unexplained weight loss, frequent infections, or sores that heal slowly.  No polyphagia, polydipsia, or polyuria. Denies visual changes or paresthesias.   Individual Medical History/ Review of Systems: Changes? :No    Past medications for mental health diagnoses include: Effexor XR, Xanax, lithium, mirtazapine caused gain weight, trazodone, Depakote, Latuda, BuSpar, Ambien caused amnesia  Allergies: Patient has no known allergies.  Current Medications:  Current  Outpatient Medications:    levonorgestrel (MIRENA) 20 MCG/DAY IUD, by Intrauterine route., Disp: , Rfl:    lurasidone (LATUDA) 40 MG TABS tablet, Take 1 tablet (40 mg total) by mouth daily with supper., Disp: 30 tablet, Rfl: 1   zaleplon (SONATA) 10 MG capsule, Take 1 capsule (10 mg total) by mouth at bedtime as needed for sleep. May repeat 1 in the middle of the night if has at least 3 hours left to sleep, prn, Disp: 30 capsule, Rfl: 5 Medication Side Effects: none  Family Medical/ Social History: Changes? none  MENTAL HEALTH EXAM:  There were no vitals taken for this visit.There is no height or weight on file to calculate BMI.  General Appearance: Casual, Neat and Well Groomed  Eye Contact:  Good  Speech:  Clear and Coherent  Volume:  Normal  Mood:  Euthymic  Affect:  Congruent  Thought Process:  Goal Directed and Descriptions of Associations: Circumstantial  Orientation:  Full (Time, Place, and Person)  Thought Content: Logical   Suicidal Thoughts:  No  Homicidal Thoughts:  No  Memory:  WNL  Judgement:  Good  Insight:  Good  Psychomotor Activity:  Normal  Concentration:  Concentration: Good and Attention Span: Good  Recall:  Good  Fund of Knowledge: Good  Language: Good  Assets:  Communication Skills Desire for Improvement Financial Resources/Insurance Housing Transportation Vocational/Educational  ADL's:  Intact  Cognition: WNL  Prognosis:  Good   Labs 02/05/2022 CBC hemoglobin 9.9, hematocrit 32.6 BMP completely normal TSH 0.619 Hemoglobin A1c 6.0 Lipid panel completely normal PCP follows  DIAGNOSES:    ICD-10-CM   1. Bipolar I disorder ([name])  F31.9     2. Insomnia, unspecified type  G47.00       Receiving Psychotherapy: No   RECOMMENDATIONS:  PDMP was reviewed.  Sonata filled 07/26/2022. I provided 30 minutes of face to face time during this encounter, including time spent before and after the visit in records review, medical decision making,  counseling pertinent to today's visit, and charting.   We discussed the lack of emotion.  Recommend either decreasing the Risperdal dose or changing it altogether.  She has taken Latuda in the past and it was effective, her insurance at the time would no longer pay for it plus it was not generic so she was switched to Risperdal.  She would like to retry the [place].  Discontinue Risperdal. Start Latuda 40 mg, 1 p.o. q. evening with at least 350 cal. Continue Sonata 10 mg, 1 p.o. nightly as needed sleep.  May repeat 1 for mid nocturnal awakening as long as she has 3 hours left to sleep, as needed. Return in 6 weeks.  Donnal Moat, PA-C

## 2022-09-16 ENCOUNTER — Encounter: Payer: Self-pay | Admitting: Physician Assistant

## 2022-09-16 ENCOUNTER — Ambulatory Visit: Payer: 59 | Admitting: Physician Assistant

## 2022-09-16 DIAGNOSIS — F319 Bipolar disorder, unspecified: Secondary | ICD-10-CM

## 2022-09-16 DIAGNOSIS — G4700 Insomnia, unspecified: Secondary | ICD-10-CM | POA: Diagnosis not present

## 2022-09-16 MED ORDER — LURASIDONE HCL 40 MG PO TABS: 40.0000 mg | ORAL_TABLET | Freq: Every day | ORAL | 5 refills | Status: DC

## 2022-09-16 NOTE — Progress Notes (Signed)
[facility]  Patient ID: Carla Gentry,  MRN: [id number]  PCP: Ricardo Jericho, NP  Date of Evaluation: 09/16/2022 Time spent:20 minutes  Chief Complaint:  Chief Complaint   Follow-up    HISTORY/CURRENT STATUS: For routine med check.  At the last visit 6 weeks ago we changed Risperdal to [place] because of lack of emotion.  Nothing major has happened in her family or at work since the end so it is difficult to say if the [place] has changed that at all.  She feels comfortable on it though and does not feel like a change is necessary.   Patient is able to enjoy things.  Energy and motivation are good.  Work is going well.   No extreme sadness, tearfulness, or feelings of hopelessness.  Sleeps well most of the time.  She does need the Sonata most nights to help.  ADLs and personal hygiene are normal.   Denies any changes in concentration, making decisions, or remembering things.  Appetite has not changed.  Weight is stable.  Denies suicidal or homicidal thoughts.  Patient denies increased energy with decreased need for sleep, increased talkativeness, racing thoughts, impulsivity or risky behaviors, increased spending, increased libido, grandiosity, increased irritability or anger, paranoia, or hallucinations.   Denies dizziness, syncope, seizures, numbness, tingling, tremor, tics, unsteady gait, slurred speech, confusion. Denies muscle or joint pain, stiffness, or dystonia. Denies unexplained weight loss, frequent infections, or sores that heal slowly.  No polyphagia, polydipsia, or polyuria. Denies visual changes or paresthesias.   Individual Medical History/ Review of Systems: Changes? :No    Past medications for mental health diagnoses include: Effexor XR, Xanax, lithium, mirtazapine caused gain weight, trazodone, Depakote, Latuda, BuSpar, Ambien caused amnesia  Allergies: Patient has no known allergies.  Current Medications:  Current Outpatient Medications:     levonorgestrel (MIRENA) 20 MCG/DAY IUD, by Intrauterine route., Disp: , Rfl:    zaleplon (SONATA) 10 MG capsule, Take 1 capsule (10 mg total) by mouth at bedtime as needed for sleep. May repeat 1 in the middle of the night if has at least 3 hours left to sleep, prn, Disp: 30 capsule, Rfl: 5   lurasidone (LATUDA) 40 MG TABS tablet, Take 1 tablet (40 mg total) by mouth daily with supper., Disp: 30 tablet, Rfl: 5 Medication Side Effects: none  Family Medical/ Social History: Changes? none  MENTAL HEALTH EXAM:  There were no vitals taken for this visit.There is no height or weight on file to calculate BMI.  General Appearance: Casual, Neat and Well Groomed  Eye Contact:  Good  Speech:  Clear and Coherent  Volume:  Normal  Mood:  Euthymic  Affect:  Congruent  Thought Process:  Goal Directed and Descriptions of Associations: Circumstantial  Orientation:  Full (Time, Place, and Person)  Thought Content: Logical   Suicidal Thoughts:  No  Homicidal Thoughts:  No  Memory:  WNL  Judgement:  Good  Insight:  Good  Psychomotor Activity:  Normal  Concentration:  Concentration: Good and Attention Span: Good  Recall:  Good  Fund of Knowledge: Good  Language: Good  Assets:  Desire for Improvement Financial Resources/Insurance Housing Transportation Vocational/Educational  ADL's:  Intact  Cognition: WNL  Prognosis:  Good   DIAGNOSES:    ICD-10-CM   1. Bipolar I disorder ([place])  F31.9     2. Insomnia, unspecified type  G47.00       Receiving Psychotherapy: No   RECOMMENDATIONS:  PDMP was reviewed.  Sonata filled 08/26/2022.  I provided 20 minutes of face to face time during this encounter, including time spent before and after the visit in records review, medical decision making, counseling pertinent to today's visit, and charting.   She is doing well.  Even though there have been no major life events that would cause a change in emotion, we agree that she should stay on the Latuda.  If  something happens where she does not have the motion then we can either decrease the dose or change antipsychotics.  No changes at this time. Sleep hygiene discussed.  Continue Latuda 40 mg, 1 p.o. q. evening with at least 350 cal. Continue Sonata 10 mg, 1 p.o. nightly as needed sleep.  May repeat 1 for mid nocturnal awakening as long as she has 3 hours left to sleep, as needed. Return in 6 months.  Donnal Moat, PA-C

## 2022-09-26 ENCOUNTER — Other Ambulatory Visit: Payer: Self-pay | Admitting: Physician Assistant

## 2022-11-19 ENCOUNTER — Ambulatory Visit
Admission: EM | Admit: 2022-11-19 | Discharge: 2022-11-19 | Disposition: A | Discharge status: Home / Self Care | Payer: 59 | Attending: Emergency Medicine | Admitting: Emergency Medicine

## 2022-11-19 DIAGNOSIS — N898 Other specified noninflammatory disorders of vagina: Secondary | ICD-10-CM | POA: Insufficient documentation

## 2022-11-19 LAB — POCT URINALYSIS DIP (MANUAL ENTRY)
Blood, UA: NEGATIVE
Glucose, UA: NEGATIVE mg/dL
Nitrite, UA: NEGATIVE
Spec Grav, UA: >=1.03 — AB (ref 1.010–1.025)
Urobilinogen, UA: 1 E.U./dL
pH, UA: 5.5 (ref 5.0–8.0)

## 2022-11-19 LAB — POCT URINE PREGNANCY: Preg Test, Ur: NEGATIVE

## 2022-11-19 MED ORDER — METRONIDAZOLE 500 MG PO TABS: 500.0000 mg | ORAL_TABLET | Freq: Two times a day (BID) | ORAL | 0 refills | Status: DC

## 2022-11-19 NOTE — ED Provider Notes (Signed)
Renaldo Fiddler    CSN: [id number] Arrival date & time: 11/19/22  1626      History   Chief Complaint Chief Complaint  Patient presents with   Vaginal Discharge   Vaginal Itching    HPI Carla Gentry is a 42 y.o. female.  Patient presents with malodorous vaginal discharge and itching x 1 day.  The discharge has a strong fishy odor.  She is sexually active in a monogamous relationship and has low suspicion for STDs.  She denies fever, rash, abdominal pain, dysuria, hematuria, pelvic pain, or other symptoms.  She denies pertinent medical history.  The history is provided by the patient and medical records.    Past Medical History:  Diagnosis Date   Anemia    Anxiety    Bipolar disorder (HCC)    Depression    Migraine    Pre-diabetes     Patient Active Problem List   Diagnosis Date Noted   Opioid use with withdrawal (HCC) 03/30/2013   Bipolar I disorder, most recent episode (or current) depressed, severe, without mention of psychotic behavior 03/30/2013   Suicidal ideation 03/30/2013    Past Surgical History:  Procedure Laterality Date   CESAREAN SECTION     CHOLECYSTECTOMY     GASTRIC BYPASS     HYSTEROSCOPY N/A 03/30/2022   Procedure: HYSTEROSCOPY;  Surgeon: Linzie Collin, MD;  Location: [facility];  Service: Gynecology;  Laterality: N/A;   INTRAUTERINE DEVICE (IUD) INSERTION N/A 03/30/2022   Procedure: INTRAUTERINE DEVICE (IUD) INSERTION;  Surgeon: Linzie Collin, MD;  Location: [facility];  Service: Gynecology;  Laterality: N/A;   IUD REMOVAL N/A 03/30/2022   Procedure: INTRAUTERINE DEVICE (IUD) REMOVAL;  Surgeon: Linzie Collin, MD;  Location: [facility];  Service: Gynecology;  Laterality: N/A;   OCCIPITAL NEURECTOMY     WISDOM TOOTH EXTRACTION      OB History     Gravida  1   Para  1   Term  1   Preterm      AB      Living  1      SAB      IAB      Ectopic      Multiple      Live Births  1            Home  Medications    Prior to Admission medications   Medication Sig Start Date End Date Taking? Authorizing Provider  metroNIDAZOLE (FLAGYL) 500 MG tablet Take 1 tablet (500 mg total) by mouth 2 (two) times daily. 11/19/22  Yes Mickie Bail, NP  levonorgestrel (MIRENA) 20 MCG/DAY IUD by Intrauterine route.    [provider]  lurasidone (LATUDA) 40 MG TABS tablet Take 1 tablet (40 mg total) by mouth daily with supper. 09/16/22   Melony Overly T, PA-C  zaleplon (SONATA) 10 MG capsule TAKE 1 BY MOUTH DAILY AT BEDTIME AS NEEDED AND MAY REPEAT 1 FOR MIDNOCTURNAL AWAKENING AS LONG AS SHE HAS 3 HOURS LEFT TO SLEEP 09/28/22   Cherie Ouch, PA-C    Family History Family History  Problem Relation Age of Onset   Hypertension Father     Social History Social History   Tobacco Use   Smoking status: Never   Smokeless tobacco: Never  Vaping Use   Vaping Use: Never used  Substance Use Topics   Alcohol use: Not Currently    Comment: rarely   Drug use: Never  Allergies   Patient has no known allergies.   Review of Systems Review of Systems  Constitutional:  Negative for chills and fever.  Gastrointestinal:  Negative for abdominal pain, nausea and vomiting.  Genitourinary:  Positive for vaginal discharge. Negative for dysuria, flank pain, frequency, hematuria and pelvic pain.     Physical Exam Triage Vital Signs ED Triage Vitals [11/19/22 1628]  Enc Vitals Group     BP      Pulse      Resp      Temp      Temp src      SpO2      Weight      Height      Head Circumference      Peak Flow      Pain Score 0     Pain Loc      Pain Edu?      Excl. in GC?    No data found.  Updated Vital Signs BP 127/83   Pulse 73   Temp 98 F (36.7 C)   Resp 18   SpO2 98%   Visual Acuity Right Eye Distance:   Left Eye Distance:   Bilateral Distance:    Right Eye Near:   Left Eye Near:    Bilateral Near:     Physical Exam Vitals and nursing note reviewed.   Constitutional:      General: She is not in acute distress.    Appearance: Normal appearance. She is well-developed. She is not ill-appearing.  HENT:     Mouth/Throat:     Mouth: Mucous membranes are moist.  Cardiovascular:     Rate and Rhythm: Normal rate and regular rhythm.     Heart sounds: Normal heart sounds.  Pulmonary:     Effort: Pulmonary effort is normal. No respiratory distress.     Breath sounds: Normal breath sounds.  Abdominal:     General: Bowel sounds are normal.     Palpations: Abdomen is soft.     Tenderness: There is no abdominal tenderness. There is no right CVA tenderness, left CVA tenderness, guarding or rebound.  Genitourinary:    Comments: Patient declines GU exam. Musculoskeletal:     Cervical back: Neck supple.  Skin:    General: Skin is warm and dry.  Neurological:     Mental Status: She is alert.  Psychiatric:        Mood and Affect: Mood normal.        Behavior: Behavior normal.      UC Treatments / Results  Labs (all labs ordered are listed, but only abnormal results are displayed) Labs Reviewed  POCT URINALYSIS DIP (MANUAL ENTRY) - Abnormal; Notable for the following components:      Result Value   Clarity, UA cloudy (*)    Bilirubin, UA small (*)    Ketones, POC UA small (15) (*)    Spec Grav, UA >=1.030 (*)    Protein Ur, POC trace (*)    Leukocytes, UA Trace (*)    All other components within normal limits  POCT URINE PREGNANCY  CERVICOVAGINAL ANCILLARY ONLY    EKG   Radiology No results found.  Procedures Procedures (including critical care time)  Medications Ordered in UC Medications - No data to display  Initial Impression / Assessment and Plan / UC Course  I have reviewed the triage vital signs and the nursing notes.  Pertinent labs & imaging results that were available during my care of the  patient were reviewed by me and considered in my medical decision making (see chart for details).    Vaginal discharge,  vaginal itching.  Urine pregnancy negative.  Patient obtained self swab for testing.  Treating with metronidazole.  Discussed that we will call if test results are positive.  Discussed that she may require treatment at that time.  Discussed that sexual partner(s) may also require treatment.  Instructed patient to abstain from sexual activity for at least 7 days.  Instructed her to follow-up with her PCP or gynecologist if her symptoms are not improving.  Patient agrees to plan of care.   Final Clinical Impressions(s) / UC Diagnoses   Final diagnoses:  Vaginal discharge  Vaginal itching     Discharge Instructions      Take metronidazole as directed.    Your vaginal tests are pending.  If your test results are positive, we will call you.  You and your sexual partner(s) may require treatment at that time.  Do not have sexual activity for at least 7 days.    Follow up with your primary care provider if your symptoms are not improving.          ED Prescriptions     Medication Sig Dispense Auth. Provider   metroNIDAZOLE (FLAGYL) 500 MG tablet Take 1 tablet (500 mg total) by mouth 2 (two) times daily. 14 tablet Mickie Bail, NP      PDMP not reviewed this encounter.   Mickie Bail, NP 11/19/22 [phone]

## 2022-11-19 NOTE — ED Triage Notes (Addendum)
Patient presents to UC for vaginal discharge, odor, and itching x 24 hrs. Not taking any OTC meds. Requesting to rule out UTI as well. Concerned with BV.

## 2022-11-19 NOTE — Discharge Instructions (Signed)
Take metronidazole as directed.   Your vaginal tests are pending.  If your test results are positive, we will call you.  You and your sexual partner(s) may require treatment at that time.  Do not have sexual activity for at least 7 days.   Follow up with your primary care provider if your symptoms are not improving.       

## 2022-11-20 LAB — CERVICOVAGINAL ANCILLARY ONLY
Bacterial Vaginitis (gardnerella): POSITIVE — AB
Candida Glabrata: NEGATIVE
Candida Vaginitis: POSITIVE — AB
Chlamydia: NEGATIVE
Comment: NEGATIVE
Comment: NEGATIVE
Comment: NEGATIVE
Comment: NEGATIVE
Comment: NEGATIVE
Comment: NORMAL
Neisseria Gonorrhea: NEGATIVE
Trichomonas: NEGATIVE

## 2022-11-24 ENCOUNTER — Telehealth (HOSPITAL_COMMUNITY): Payer: Self-pay | Admitting: Emergency Medicine

## 2022-11-24 MED ORDER — FLUCONAZOLE 150 MG PO TABS: 150.0000 mg | ORAL_TABLET | Freq: Once | ORAL | 0 refills | Status: AC

## 2022-12-17 ENCOUNTER — Ambulatory Visit: Payer: 59 | Admitting: Nurse Practitioner

## 2022-12-17 ENCOUNTER — Encounter: Payer: Self-pay | Admitting: Nurse Practitioner

## 2022-12-17 VITALS — BP 100/68 | HR 66 | Temp 98.8°F | Ht 61.0 in | Wt 107.8 lb

## 2022-12-17 DIAGNOSIS — Z13220 Encounter for screening for lipoid disorders: Secondary | ICD-10-CM

## 2022-12-17 DIAGNOSIS — Z1159 Encounter for screening for other viral diseases: Secondary | ICD-10-CM

## 2022-12-17 DIAGNOSIS — Z1329 Encounter for screening for other suspected endocrine disorder: Secondary | ICD-10-CM | POA: Diagnosis not present

## 2022-12-17 DIAGNOSIS — D649 Anemia, unspecified: Secondary | ICD-10-CM

## 2022-12-17 DIAGNOSIS — Z0000 Encounter for general adult medical examination without abnormal findings: Secondary | ICD-10-CM | POA: Diagnosis not present

## 2022-12-17 DIAGNOSIS — F319 Bipolar disorder, unspecified: Secondary | ICD-10-CM

## 2022-12-17 DIAGNOSIS — R7303 Prediabetes: Secondary | ICD-10-CM

## 2022-12-17 DIAGNOSIS — Z23 Encounter for immunization: Secondary | ICD-10-CM

## 2022-12-17 DIAGNOSIS — Z1231 Encounter for screening mammogram for malignant neoplasm of breast: Secondary | ICD-10-CM

## 2022-12-17 DIAGNOSIS — Z114 Encounter for screening for human immunodeficiency virus [HIV]: Secondary | ICD-10-CM

## 2022-12-17 LAB — IBC + FERRITIN
Ferritin: 3.1 ng/mL — ABNORMAL LOW (ref 10.0–291.0)
Iron: 26 ug/dL — ABNORMAL LOW (ref 42–145)
Saturation Ratios: 4.7 % — ABNORMAL LOW (ref 20.0–50.0)
TIBC: 547.4 ug/dL — ABNORMAL HIGH (ref 250.0–450.0)
Transferrin: 391 mg/dL — ABNORMAL HIGH (ref 212.0–360.0)

## 2022-12-17 LAB — HEMOGLOBIN A1C: Hgb A1c MFr Bld: 6.2 % (ref 4.6–6.5)

## 2022-12-17 LAB — COMPREHENSIVE METABOLIC PANEL
ALT: 11 U/L (ref 0–35)
AST: 20 U/L (ref 0–37)
Albumin: 4.2 g/dL (ref 3.5–5.2)
Alkaline Phosphatase: 59 U/L (ref 39–117)
BUN: 17 mg/dL (ref 6–23)
CO2: 26 mEq/L (ref 19–32)
Calcium: 9.3 mg/dL (ref 8.4–10.5)
Chloride: 104 mEq/L (ref 96–112)
Creatinine, Ser: 0.68 mg/dL (ref 0.40–1.20)
GFR: 108.24 mL/min (ref >60.00)
Glucose, Bld: 84 mg/dL (ref 70–99)
Potassium: 3.7 mEq/L (ref 3.5–5.1)
Sodium: 138 mEq/L (ref 135–145)
Total Bilirubin: 0.6 mg/dL (ref 0.2–1.2)
Total Protein: 7.1 g/dL (ref 6.0–8.3)

## 2022-12-17 LAB — CBC WITH DIFFERENTIAL/PLATELET
Basophils Absolute: 0 10*3/uL (ref 0.0–0.1)
Basophils Relative: 0.8 % (ref 0.0–3.0)
Eosinophils Absolute: 0.2 10*3/uL (ref 0.0–0.7)
Eosinophils Relative: 3.8 % (ref 0.0–5.0)
HCT: 31.8 % — ABNORMAL LOW (ref 36.0–46.0)
Hemoglobin: 9.6 g/dL — ABNORMAL LOW (ref 12.0–15.0)
Lymphocytes Relative: 34.2 % (ref 12.0–46.0)
Lymphs Abs: 1.8 10*3/uL (ref 0.7–4.0)
MCHC: 30.3 g/dL (ref 30.0–36.0)
MCV: 70.7 fl — ABNORMAL LOW (ref 78.0–100.0)
Monocytes Absolute: 0.5 10*3/uL (ref 0.1–1.0)
Monocytes Relative: 9.1 % (ref 3.0–12.0)
Neutro Abs: 2.7 10*3/uL (ref 1.4–7.7)
Neutrophils Relative %: 52.1 % (ref 43.0–77.0)
Platelets: 303 10*3/uL (ref 150.0–400.0)
RBC: 4.5 Mil/uL (ref 3.87–5.11)
RDW: 17.6 % — ABNORMAL HIGH (ref 11.5–15.5)
WBC: 5.3 10*3/uL (ref 4.0–10.5)

## 2022-12-17 LAB — VITAMIN D 25 HYDROXY (VIT D DEFICIENCY, FRACTURES): VITD: 13.88 ng/mL — ABNORMAL LOW (ref 30.00–100.00)

## 2022-12-17 LAB — LIPID PANEL
Cholesterol: 148 mg/dL (ref 0–200)
HDL: 54.6 mg/dL (ref >39.00)
LDL Cholesterol: 85 mg/dL (ref 0–99)
NonHDL: 93.72
Total CHOL/HDL Ratio: 3
Triglycerides: 46 mg/dL (ref 0.0–149.0)
VLDL: 9.2 mg/dL (ref 0.0–40.0)

## 2022-12-17 LAB — TSH: TSH: 1.1 u[IU]/mL (ref 0.35–5.50)

## 2022-12-17 NOTE — Assessment & Plan Note (Signed)
Physical exam complete. Lab work as outlined. Will contact patient with results. Pap- due next month, she will follow up with her Ob-Gyn to schedule. Mammo- due in October, order placed, encouraged patient to call to schedule. Flu vaccine- UTD. Tetanus vaccine- due, given today in office. Declined additional COVID vaccines. HIV/Hep C screenings- today in lab work. Recommended establishing with Dentist for annual exam. Encouraged healthy diet and to begin exercising. Return to care in one year, sooner PRN.

## 2022-12-17 NOTE — Assessment & Plan Note (Signed)
Has never been treated. Asymptomatic. Will check CBC and iron panel today.  Lab Results  Component Value Date   WBC 5.3 02/05/2022   HGB 9.9 (L) 02/05/2022   HCT 32.6 (L) 02/05/2022   MCV 76 (L) 02/05/2022   PLT 274 02/05/2022

## 2022-12-17 NOTE — Progress Notes (Signed)
Bethanie Dicker, NP-C Phone: [phone]  Carla Gentry is a 42 y.o. female who presents today to establish care and for annual exam. She has no complaints or new concerns today. She is followed by Psychiatry.   Diet: Fair- breakfast and lunch are quick, grab and go foods. Dinner is biggest meal. Drinks soda Exercise: None Pap smear: 01/22/2022- HPV positive, repeat in one year- Due in July Mammogram: 04/02/2022 Family history-  Colon cancer: No  Breast cancer: No  Ovarian cancer: No Menses: IUD Sexually active: Yes Vaccines-   Flu: UTD  Tetanus: 10+ years ago, Due!  COVID19: x 3 HIV screening: Today Hep C Screening: Today Tobacco use: No Alcohol use: Yes, special occasions/once a month Illicit Drug use: No Dentist: No, needs to establish Ophthalmology: No, Hx of lasix   Active Ambulatory Problems    Diagnosis Date Noted   Opioid use with withdrawal (HCC) 03/30/2013   Bipolar 1 disorder (HCC) 03/30/2013   Preventative health care 12/17/2022   Anemia 12/17/2022   Prediabetes 12/17/2022   Resolved Ambulatory Problems    Diagnosis Date Noted   Suicidal ideation 03/30/2013   Past Medical History:  Diagnosis Date   Anxiety    Bipolar disorder (HCC)    Depression    Migraine    Pre-diabetes     Family History  Problem Relation Age of Onset   Hypertension Father    Depression Maternal Grandfather    Cancer Paternal Grandmother     Social History   Socioeconomic History   Marital status: Married    Spouse name: Gregary Signs   Number of children: 1   Years of education: Not on file   Highest education level: Not on file  Occupational History   Not on file  Tobacco Use   Smoking status: Never   Smokeless tobacco: Never  Vaping Use   Vaping Use: Never used  Substance and Sexual Activity   Alcohol use: Not Currently    Comment: rarely   Drug use: Never   Sexual activity: Yes    Birth control/protection: I.U.D.  Other Topics Concern   Not on file  Social  History Narrative   Not on file   Social Determinants of Health   Financial Resource Strain: Not on file  Food Insecurity: Not on file  Transportation Needs: Not on file  Physical Activity: Not on file  Stress: Not on file  Social Connections: Not on file  Intimate Partner Violence: Not on file    ROS  General:  Negative for unexplained weight loss, fever Skin: Negative for new or changing mole, sore that won't heal HEENT: Negative for trouble hearing, trouble seeing, ringing in ears, mouth sores, hoarseness, change in voice, dysphagia. CV:  Negative for chest pain, dyspnea, edema, palpitations Resp: Negative for cough, dyspnea, hemoptysis GI: Negative for nausea, vomiting, diarrhea, constipation, abdominal pain, melena, hematochezia. GU: Negative for dysuria, incontinence, urinary hesitance, hematuria, vaginal or penile discharge, polyuria, sexual difficulty, lumps in testicle or breasts MSK: Negative for muscle cramps or aches, joint pain or swelling Neuro: Negative for headaches, weakness, numbness, dizziness, passing out/fainting Psych: Negative for depression, anxiety, memory problems  Objective  Physical Exam Vitals:   12/17/22 0910  BP: 100/68  Pulse: 66  Temp: 98.8 F (37.1 C)  SpO2: 99%    BP Readings from Last 3 Encounters:  12/17/22 100/68  11/19/22 127/83  03/30/22 (!) 111/53   Wt Readings from Last 3 Encounters:  12/17/22 107 lb 12.8 oz (48.9 kg)  03/30/22  115 lb (52.2 kg)  03/03/22 113 lb 6.4 oz (51.4 kg)    Physical Exam Constitutional:      General: She is not in acute distress.    Appearance: Normal appearance.  HENT:     Head: Normocephalic.     Right Ear: Tympanic membrane normal.     Left Ear: Tympanic membrane normal.     Nose: Nose normal.     Mouth/Throat:     Mouth: Mucous membranes are moist.     Pharynx: Oropharynx is clear.  Eyes:     Conjunctiva/sclera: Conjunctivae normal.     Pupils: Pupils are equal, round, and reactive to  light.  Neck:     Thyroid: No thyromegaly.  Cardiovascular:     Rate and Rhythm: Normal rate and regular rhythm.     Heart sounds: Normal heart sounds.  Pulmonary:     Effort: Pulmonary effort is normal.     Breath sounds: Normal breath sounds.  Abdominal:     General: Abdomen is flat. Bowel sounds are normal.     Palpations: Abdomen is soft. There is no mass.     Tenderness: There is no abdominal tenderness.  Musculoskeletal:        General: Normal range of motion.  Lymphadenopathy:     Cervical: No cervical adenopathy.  Skin:    General: Skin is warm and dry.     Findings: No rash.  Neurological:     General: No focal deficit present.     Mental Status: She is alert.  Psychiatric:        Mood and Affect: Mood normal.        Behavior: Behavior normal.    Assessment/Plan:   Preventative health care Assessment & Plan: Physical exam complete. Lab work as outlined. Will contact patient with results. Pap- due next month, she will follow up with her Ob-Gyn to schedule. Mammo- due in October, order placed, encouraged patient to call to schedule. Flu vaccine- UTD. Tetanus vaccine- due, given today in office. Declined additional COVID vaccines. HIV/Hep C screenings- today in lab work. Recommended establishing with Dentist for annual exam. Encouraged healthy diet and to begin exercising. Return to care in one year, sooner PRN.   Orders: -     Comprehensive metabolic panel -     VITAMIN D 25 Hydroxy (Vit-D Deficiency, Fractures)  Bipolar 1 disorder (HCC) Assessment & Plan: Chronic. Stable on current medication regimen. Continue. Follow up with Psychiatry.    Anemia, unspecified type Assessment & Plan: Has never been treated. Asymptomatic. Will check CBC and iron panel today.  Lab Results  Component Value Date   WBC 5.3 02/05/2022   HGB 9.9 (L) 02/05/2022   HCT 32.6 (L) 02/05/2022   MCV 76 (L) 02/05/2022   PLT 274 02/05/2022    Orders: -     CBC with  Differential/Platelet -     IBC + Ferritin  Prediabetes Assessment & Plan: A1c last year- 6.0. Thought to be due to taking Risperidone at the time. She is no longer taking. Will recheck A1c today.   Orders: -     Hemoglobin A1c  Thyroid disorder screen -     TSH  Lipid screening -     Lipid panel  Encounter for hepatitis C screening test for low risk patient -     Hepatitis C antibody  Encounter for screening for HIV -     HIV Antibody (routine testing w rflx)  Need for Tdap vaccination -  Tdap vaccine greater than or equal to 7yo IM  Screening mammogram for breast cancer -     3D Screening Mammogram, Left and Right; Future   Return in about 1 year (around 12/17/2023) for Annual Exam, sooner PRN.   Bethanie Dicker, NP-C [facility] - [employer]

## 2022-12-17 NOTE — Assessment & Plan Note (Signed)
A1c last year- 6.0. Thought to be due to taking Risperidone at the time. She is no longer taking. Will recheck A1c today.

## 2022-12-17 NOTE — Patient Instructions (Signed)
YOUR MAMMOGRAM IS DUE, PLEASE CALL AND GET THIS SCHEDULED! [facility] - call [phone]    

## 2022-12-17 NOTE — Assessment & Plan Note (Signed)
Chronic. Stable on current medication regimen. Continue. Follow up with Psychiatry.  

## 2022-12-18 LAB — HEPATITIS C ANTIBODY: Hepatitis C Ab: NONREACTIVE

## 2022-12-18 LAB — HIV ANTIBODY (ROUTINE TESTING W REFLEX): HIV 1&2 Ab, 4th Generation: NONREACTIVE

## 2022-12-22 ENCOUNTER — Other Ambulatory Visit: Payer: Self-pay | Admitting: Nurse Practitioner

## 2022-12-22 DIAGNOSIS — E559 Vitamin D deficiency, unspecified: Secondary | ICD-10-CM

## 2022-12-22 DIAGNOSIS — D509 Iron deficiency anemia, unspecified: Secondary | ICD-10-CM

## 2022-12-22 MED ORDER — VITAMIN D (ERGOCALCIFEROL) 1.25 MG (50000 UNIT) PO CAPS
50000.0000 [IU] | ORAL_CAPSULE | ORAL | 1 refills | Status: DC
Start: 2022-12-22 — End: 2023-10-28

## 2022-12-28 ENCOUNTER — Inpatient Hospital Stay: Payer: 59 | Attending: Internal Medicine | Admitting: Internal Medicine

## 2022-12-28 ENCOUNTER — Inpatient Hospital Stay: Payer: 59

## 2022-12-28 VITALS — BP 96/60 | HR 64 | Temp 99.2°F | Wt 107.8 lb

## 2022-12-28 DIAGNOSIS — Z9889 Other specified postprocedural states: Secondary | ICD-10-CM

## 2022-12-28 DIAGNOSIS — D508 Other iron deficiency anemias: Secondary | ICD-10-CM

## 2022-12-28 DIAGNOSIS — D509 Iron deficiency anemia, unspecified: Secondary | ICD-10-CM | POA: Diagnosis present

## 2022-12-28 DIAGNOSIS — Z9884 Bariatric surgery status: Secondary | ICD-10-CM | POA: Diagnosis not present

## 2022-12-28 NOTE — Patient Instructions (Signed)
Barimelts 1 tablet daily. Add vitamin C.

## 2022-12-28 NOTE — Progress Notes (Signed)
[facility]  [phone] [phone]  ID: Alen Bleacher OB: 1980/10/13  MR#: [id number]  AOZ#:[id number]  Patient Care Team: Bethanie Dicker, NP as PCP - General (Nurse Practitioner)  REFERRING PROVIDER: Bethanie Dicker  REASON FOR REFERRAL: Iron deficiency anemia  HPI: Carla Gentry is a 42 y.o. female with past medical history of migraine, anemia, depression was referred to hematology for management of iron deficiency anemia.  On lab review, patient has anemia at least since 2020.  She has IUD in place so does not get have heavy periods.  Denies any bleeding in urine or stool.  Has history of gastric bypass surgery in 2010.  Never had colonoscopy.  Had endoscopy prior to his surgery in 2010.  Occasional NSAID use.  Energy level is good.  Denies any shortness of breath, chest pain, dizziness, pica. She has been referred to GI by her PCP.  REVIEW OF SYSTEMS:   ROS  As per HPI. Otherwise, a complete review of systems is negative.  PAST MEDICAL HISTORY: Past Medical History:  Diagnosis Date   Anemia    Anxiety    Bipolar disorder (HCC)    Depression    Migraine    Pre-diabetes     PAST SURGICAL HISTORY: Past Surgical History:  Procedure Laterality Date   CESAREAN SECTION     CHOLECYSTECTOMY     GASTRIC BYPASS     HYSTEROSCOPY N/A 03/30/2022   Procedure: HYSTEROSCOPY;  Surgeon: Linzie Collin, MD;  Location: [facility];  Service: Gynecology;  Laterality: N/A;   INTRAUTERINE DEVICE (IUD) INSERTION N/A 03/30/2022   Procedure: INTRAUTERINE DEVICE (IUD) INSERTION;  Surgeon: Linzie Collin, MD;  Location: [facility];  Service: Gynecology;  Laterality: N/A;   IUD REMOVAL N/A 03/30/2022   Procedure: INTRAUTERINE DEVICE (IUD) REMOVAL;  Surgeon: Linzie Collin, MD;  Location: [facility];  Service: Gynecology;  Laterality: N/A;   OCCIPITAL NEURECTOMY     WISDOM TOOTH EXTRACTION      FAMILY HISTORY: Family History  Problem Relation Age  of Onset   Hypertension Father    Depression Maternal Grandfather    Cancer Paternal Grandmother     HEALTH MAINTENANCE: Social History   Tobacco Use   Smoking status: Never   Smokeless tobacco: Never  Vaping Use   Vaping Use: Never used  Substance Use Topics   Alcohol use: Not Currently    Comment: rarely   Drug use: Never     No Known Allergies  Current Outpatient Medications  Medication Sig Dispense Refill   levonorgestrel (MIRENA) 20 MCG/DAY IUD by Intrauterine route.     lurasidone (LATUDA) 40 MG TABS tablet Take 1 tablet (40 mg total) by mouth daily with supper. 30 tablet 5   Vitamin D, Ergocalciferol, (DRISDOL) 1.25 MG (50000 UNIT) CAPS capsule Take 1 capsule (50,000 Units total) by mouth every 7 (seven) days. 13 capsule 1   zaleplon (SONATA) 10 MG capsule TAKE 1 BY MOUTH DAILY AT BEDTIME AS NEEDED AND MAY REPEAT 1 FOR MIDNOCTURNAL AWAKENING AS LONG AS SHE HAS 3 HOURS LEFT TO SLEEP 30 capsule 5   No current facility-administered medications for this visit.    OBJECTIVE: Vitals:   12/28/22 1125  BP: 96/60  Pulse: 64  Temp: 99.2 F (37.3 C)  SpO2: 98%     Body mass index is 20.37 kg/m.      General: Well-developed, well-nourished, no acute distress. Eyes: Pink conjunctiva, anicteric sclera. HEENT: Normocephalic, moist mucous membranes, clear oropharnyx. Lungs:  Clear to auscultation bilaterally. Heart: Regular rate and rhythm. No rubs, murmurs, or gallops. Abdomen: Soft, nontender, nondistended. No organomegaly noted, normoactive bowel sounds. Musculoskeletal: No edema, cyanosis, or clubbing. Neuro: Alert, answering all questions appropriately. Cranial nerves grossly intact. Skin: No rashes or petechiae noted. Psych: Normal affect. Lymphatics: No cervical, calvicular, axillary or inguinal LAD.   LAB RESULTS:  Lab Results  Component Value Date   NA 138 12/17/2022   K 3.7 12/17/2022   CL 104 12/17/2022   CO2 26 12/17/2022   GLUCOSE 84 12/17/2022    BUN 17 12/17/2022   CREATININE 0.68 12/17/2022   CALCIUM 9.3 12/17/2022   PROT 7.1 12/17/2022   ALBUMIN 4.2 12/17/2022   AST 20 12/17/2022   ALT 11 12/17/2022   ALKPHOS 59 12/17/2022   BILITOT 0.6 12/17/2022   GFRNONAA 102 03/24/2019   GFRAA 118 03/24/2019    Lab Results  Component Value Date   WBC 5.3 12/17/2022   NEUTROABS 2.7 12/17/2022   HGB 9.6 (L) 12/17/2022   HCT 31.8 (L) 12/17/2022   MCV 70.7 (L) 12/17/2022   PLT 303.0 12/17/2022    Lab Results  Component Value Date   TIBC 547.4 (H) 12/17/2022   FERRITIN 3.1 (L) 12/17/2022   IRONPCTSAT 4.7 (L) 12/17/2022     STUDIES: No results found.  ASSESSMENT AND PLAN:   CIIN OBRYAN is a 42 y.o. female with pmh of migraine, anemia, depression was referred to hematology for management of iron deficiency anemia.  # Iron deficiency anemia # History of gastric bypass surgery in 2010 -Progressive.  Present at least since 2020.  Hemoglobin 9.6.  Ferritin 3.  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  Has been referred to GI to rule out occult bleed. -I discussed with her about both oral iron versus IV iron.  Advised that IV iron would be preferable considering severe iron deficiency and her history of gastric bypass surgery.  I will consider IV Venofer 300 mg weekly x 3 doses.  Side effects such as nausea and back pain was discussed.  Risk of allergic reaction was also discussed. -Will check B12 level with next set of blood work due to her history of gastric bypass surgery  Orders Placed This Encounter  Procedures   CBC with Differential (Cancer Center Only)   Iron and TIBC   Ferritin   Vitamin B12   Folate   Schedule for IV Venofer 300 mg weekly x 3 doses RTC in 4 months for MD visit, labs  Patient expressed understanding and was in agreement with this plan. She also understands that She can call clinic at any time with any questions, concerns, or complaints.   I spent a total of 45 minutes  reviewing chart data, face-to-face evaluation with the patient, counseling and coordination of care as detailed above.  Michaelyn Barter, MD   12/28/2022 12:27 PM

## 2023-01-05 MED FILL — Iron Sucrose Inj 20 MG/ML (Fe Equiv): INTRAVENOUS | Qty: 15 | Status: AC

## 2023-01-06 ENCOUNTER — Inpatient Hospital Stay: Payer: 59

## 2023-01-06 VITALS — BP 97/55 | HR 58 | Temp 97.6°F

## 2023-01-06 DIAGNOSIS — D509 Iron deficiency anemia, unspecified: Secondary | ICD-10-CM | POA: Diagnosis not present

## 2023-01-06 DIAGNOSIS — D508 Other iron deficiency anemias: Secondary | ICD-10-CM

## 2023-01-06 MED ORDER — SODIUM CHLORIDE 0.9 % IV SOLN
Freq: Once | INTRAVENOUS | Status: AC
  Filled 2023-01-06: qty 250

## 2023-01-06 MED ORDER — SODIUM CHLORIDE 0.9 % IV SOLN
300.0000 mg | Freq: Once | INTRAVENOUS | Status: AC
  Administered 2023-01-06: 300 mg via INTRAVENOUS
  Filled 2023-01-06: qty 300

## 2023-01-06 NOTE — Patient Instructions (Signed)

## 2023-01-13 ENCOUNTER — Inpatient Hospital Stay: Payer: 59

## 2023-01-13 VITALS — BP 111/69 | HR 57 | Temp 97.8°F

## 2023-01-13 DIAGNOSIS — D508 Other iron deficiency anemias: Secondary | ICD-10-CM

## 2023-01-13 DIAGNOSIS — D509 Iron deficiency anemia, unspecified: Secondary | ICD-10-CM | POA: Diagnosis not present

## 2023-01-13 MED ORDER — SODIUM CHLORIDE 0.9 % IV SOLN
300.0000 mg | Freq: Once | INTRAVENOUS | Status: AC
  Administered 2023-01-13: 300 mg via INTRAVENOUS
  Filled 2023-01-13: qty 300

## 2023-01-13 MED ORDER — SODIUM CHLORIDE 0.9 % IV SOLN
Freq: Once | INTRAVENOUS | Status: AC
  Filled 2023-01-13: qty 250

## 2023-01-13 NOTE — Patient Instructions (Signed)
Iron Sucrose Injection What is this medication? IRON SUCROSE (EYE ern SOO krose) treats low levels of iron (iron deficiency anemia) in people with kidney disease. Iron is a mineral that plays an important role in making red blood cells, which carry oxygen from your lungs to the rest of your body. This medicine may be used for other purposes; ask your health care provider or pharmacist if you have questions. COMMON BRAND NAME(S): Venofer What should I tell my care team before I take this medication? They need to know if you have any of these conditions: Anemia not caused by low iron levels Heart disease High levels of iron in the blood Kidney disease Liver disease An unusual or allergic reaction to iron, other medications, foods, dyes, or preservatives Pregnant or trying to get pregnant Breastfeeding How should I use this medication? This medication is for infusion into a vein. It is given in a hospital or clinic setting. Talk to your care team about the use of this medication in children. While this medication may be prescribed for children as young as 2 years for selected conditions, precautions do apply. Overdosage: If you think you have taken too much of this medicine contact a poison control center or emergency room at once. NOTE: This medicine is only for you. Do not share this medicine with others. What if I miss a dose? Keep appointments for follow-up doses. It is important not to miss your dose. Call your care team if you are unable to keep an appointment. What may interact with this medication? Do not take this medication with any of the following: Deferoxamine Dimercaprol Other iron products This medication may also interact with the following: Chloramphenicol Deferasirox This list may not describe all possible interactions. Give your health care provider a list of all the medicines, herbs, non-prescription drugs, or dietary supplements you use. Also tell them if you smoke,  drink alcohol, or use illegal drugs. Some items may interact with your medicine. What should I watch for while using this medication? Visit your care team regularly. Tell your care team if your symptoms do not start to get better or if they get worse. You may need blood work done while you are taking this medication. You may need to follow a special diet. Talk to your care team. Foods that contain iron include: whole grains/cereals, dried fruits, beans, or peas, leafy green vegetables, and organ meats (liver, kidney). What side effects may I notice from receiving this medication? Side effects that you should report to your care team as soon as possible: Allergic reactions--skin rash, itching, hives, swelling of the face, lips, tongue, or throat Low blood pressure--dizziness, feeling faint or lightheaded, blurry vision Shortness of breath Side effects that usually do not require medical attention (report to your care team if they continue or are bothersome): Flushing Headache Joint pain Muscle pain Nausea Pain, redness, or irritation at injection site This list may not describe all possible side effects. Call your doctor for medical advice about side effects. You may report side effects to FDA at 1-800-FDA-1088. Where should I keep my medication? This medication is given in a hospital or clinic and will not be stored at home. NOTE: This sheet is a summary. It may not cover all possible information. If you have questions about this medicine, talk to your doctor, pharmacist, or health care provider.  2024 Elsevier/Gold Standard (2021-12-24 00:00:00)  

## 2023-01-19 MED FILL — Iron Sucrose Inj 20 MG/ML (Fe Equiv): INTRAVENOUS | Qty: 15 | Status: AC

## 2023-01-20 ENCOUNTER — Inpatient Hospital Stay: Payer: 59

## 2023-01-20 VITALS — BP 111/73 | HR 70 | Temp 97.6°F

## 2023-01-20 DIAGNOSIS — D509 Iron deficiency anemia, unspecified: Secondary | ICD-10-CM | POA: Diagnosis not present

## 2023-01-20 DIAGNOSIS — D508 Other iron deficiency anemias: Secondary | ICD-10-CM

## 2023-01-20 MED ORDER — SODIUM CHLORIDE 0.9 % IV SOLN
300.0000 mg | Freq: Once | INTRAVENOUS | Status: AC
  Administered 2023-01-20: 300 mg via INTRAVENOUS
  Filled 2023-01-20: qty 300

## 2023-01-20 MED ORDER — SODIUM CHLORIDE 0.9 % IV SOLN
Freq: Once | INTRAVENOUS | Status: AC
  Filled 2023-01-20: qty 250

## 2023-01-20 NOTE — Progress Notes (Signed)
Pt verbalizes and understands risks of venofer infusions. Declines post observation period.

## 2023-02-02 ENCOUNTER — Other Ambulatory Visit: Payer: Self-pay

## 2023-02-02 NOTE — Progress Notes (Signed)
Celso Amy, PA-C [address]  [address]  [place], [place] [address]  Main: [phone]  Fax: [phone]   Gastroenterology Consultation  Referring Provider:     Bethanie Dicker, NP Primary Care Physician:  Bethanie Dicker, NP Primary Gastroenterologist:  Celso Amy, PA-C / Dr. Wyline Mood   Reason for Consultation:     Iron deficiency anemia        HPI:   Carla Gentry is a 42 y.o. y/o female referred for consultation & management  by Bethanie Dicker, NP.    41 y.o. female with past medical history of migraine, anemia, depression was referred for management of iron deficiency anemia.    She saw hematologist Dr. Michae Kava 12/28/2022 and was scheduled for IV iron.  Labs 12/17/2022 showed hemoglobin 9.6, normal CMP, low iron 26, ferritin 3.1, iron saturation 4.7.  Low vitamin D.   On lab review, patient has anemia at least since 2020.  She has IUD in place so does not get have heavy periods.  Denies any bleeding in urine or stool.  Has history of gastric bypass surgery in 2010.  Never had colonoscopy.  Had endoscopy prior to gastric bypass surgery in 2010.  Occasional NSAID use.   Energy level is good.  Denies any shortness of breath, chest pain, dizziness, pica.  GI symptoms:  None.  Denies abdominal pain, heartburn, dysphagia, bowel irregularities, constipation, melena, or hematochezia.  No previous GI evaluation or colonoscopy.  She had EGD prior to gastric bypass in 2010.   Past Medical History:  Diagnosis Date   Anemia    Anxiety    Bipolar disorder (HCC)    Depression    Migraine    Pre-diabetes     Past Surgical History:  Procedure Laterality Date   CESAREAN SECTION     CHOLECYSTECTOMY     GASTRIC BYPASS     HYSTEROSCOPY N/A 03/30/2022   Procedure: HYSTEROSCOPY;  Surgeon: Linzie Collin, MD;  Location: [facility];  Service: Gynecology;  Laterality: N/A;   INTRAUTERINE DEVICE (IUD) INSERTION N/A 03/30/2022   Procedure: INTRAUTERINE DEVICE (IUD) INSERTION;   Surgeon: Linzie Collin, MD;  Location: [facility];  Service: Gynecology;  Laterality: N/A;   IUD REMOVAL N/A 03/30/2022   Procedure: INTRAUTERINE DEVICE (IUD) REMOVAL;  Surgeon: Linzie Collin, MD;  Location: [facility];  Service: Gynecology;  Laterality: N/A;   OCCIPITAL NEURECTOMY     WISDOM TOOTH EXTRACTION      Prior to Admission medications   Medication Sig Start Date End Date Taking? Authorizing Provider  fluconazole (DIFLUCAN) 150 MG tablet Take by mouth. 11/24/22   [provider]  gabapentin (NEURONTIN) 600 MG tablet Take by mouth. 11/26/16   [provider]  lamoTRIgine (LAMICTAL) 200 MG tablet Take by mouth.    [provider]  levonorgestrel (MIRENA) 20 MCG/DAY IUD by Intrauterine route.    [provider]  lurasidone (LATUDA) 40 MG TABS tablet Take 1 tablet (40 mg total) by mouth daily with supper. 09/16/22   Melony Overly T, PA-C  Vitamin D, Ergocalciferol, (DRISDOL) 1.25 MG (50000 UNIT) CAPS capsule Take 1 capsule (50,000 Units total) by mouth every 7 (seven) days. 12/22/22   Bethanie Dicker, NP  zaleplon (SONATA) 10 MG capsule TAKE 1 BY MOUTH DAILY AT BEDTIME AS NEEDED AND MAY REPEAT 1 FOR MIDNOCTURNAL AWAKENING AS LONG AS SHE HAS 3 HOURS LEFT TO SLEEP 09/28/22   Cherie Ouch, PA-C    Family History  Problem Relation  Age of Onset   Hypertension Father    Depression Maternal Grandfather    Cancer Paternal Grandmother      Social History   Tobacco Use   Smoking status: Never   Smokeless tobacco: Never  Vaping Use   Vaping status: Never Used  Substance Use Topics   Alcohol use: Not Currently    Comment: rarely   Drug use: Never    Allergies as of 02/03/2023   (No Known Allergies)    Review of Systems:    All systems reviewed and negative except where noted in HPI.   Physical Exam:  There were no vitals taken for this visit. No LMP recorded. (Menstrual status: IUD). Psych:  Alert and cooperative. Normal mood and  affect. General:   Alert,  Well-developed, well-nourished, pleasant and cooperative in NAD Head:  Normocephalic and atraumatic. Eyes:  Sclera clear, no icterus.   Conjunctiva pink. Lungs:  Respirations even and unlabored.  Clear throughout to auscultation.   No wheezes, crackles, or rhonchi. No acute distress. Heart:  Regular rate and rhythm; no murmurs, clicks, rubs, or gallops. Abdomen:  Normal bowel sounds.  No bruits.  Soft, and non-distended without masses, hepatosplenomegaly or hernias noted.  No Tenderness.  No guarding or rebound tenderness.    Neurologic:  Alert and oriented x3;  grossly normal neurologically. Psych:  Alert and cooperative. Normal mood and affect.  Imaging Studies: No results found.  Assessment and Plan:   Carla Gentry is a 42 y.o. y/o female has been referred for chronic iron deficiency anemia.  History of gastric bypass in 2010.  No previous GI evaluation.  Currently receiving IV iron infusion through hematology.  She has no GI symptoms.  Chronic iron deficiency anemia likely due to gastric bypass.  Iron deficiency anemia  Scheduling EGD & Colonoscopy I discussed risks of EGD and colonoscopy with patient to include risk of bleeding, perforation, and risk of sedation.  Patient expressed understanding and agrees to proceed with procedures.    If EGD and colonoscopy are unrevealing, consider capsule endoscopy.  Continue to follow-up with hematology for IV iron infusion and labs.  History of gastric bypass in 2010  Follow up as needed based on EGD and colonoscopy results and GI symptoms.  Celso Amy, PA-C

## 2023-02-03 ENCOUNTER — Encounter: Payer: Self-pay | Admitting: Physician Assistant

## 2023-02-03 ENCOUNTER — Ambulatory Visit (INDEPENDENT_AMBULATORY_CARE_PROVIDER_SITE_OTHER): Payer: 59 | Admitting: Physician Assistant

## 2023-02-03 VITALS — BP 95/61 | HR 79 | Temp 98.6°F | Ht 61.0 in | Wt 116.6 lb

## 2023-02-03 DIAGNOSIS — D509 Iron deficiency anemia, unspecified: Secondary | ICD-10-CM

## 2023-02-03 DIAGNOSIS — Z9884 Bariatric surgery status: Secondary | ICD-10-CM

## 2023-02-03 MED ORDER — SUTAB 1479-225-188 MG PO TABS: 24.0000 | ORAL_TABLET | Freq: Once | ORAL | 0 refills | Status: AC

## 2023-03-16 ENCOUNTER — Encounter: Payer: Self-pay | Admitting: Physician Assistant

## 2023-03-16 ENCOUNTER — Ambulatory Visit: Payer: 59 | Admitting: Physician Assistant

## 2023-03-16 DIAGNOSIS — G4700 Insomnia, unspecified: Secondary | ICD-10-CM

## 2023-03-16 DIAGNOSIS — F319 Bipolar disorder, unspecified: Secondary | ICD-10-CM | POA: Diagnosis not present

## 2023-03-16 DIAGNOSIS — E559 Vitamin D deficiency, unspecified: Secondary | ICD-10-CM

## 2023-03-16 MED ORDER — ZALEPLON 10 MG PO CAPS: 10.0000 mg | ORAL_CAPSULE | Freq: Every evening | ORAL | 5 refills | Status: DC | PRN

## 2023-03-16 MED ORDER — LURASIDONE HCL 40 MG PO TABS: 40.0000 mg | ORAL_TABLET | Freq: Every day | ORAL | 5 refills | Status: DC

## 2023-03-16 NOTE — Progress Notes (Signed)
[facility] Check  Patient ID: Carla Gentry,  MRN: [id number]  PCP: Bethanie Dicker, NP  Date of Evaluation: 03/16/2023. Time spent: 32 minutes  Chief Complaint:  Chief Complaint   Follow-up    HISTORY/CURRENT STATUS: For routine 6 month med check.  Carla Gentry is doing well. She is able to enjoy things.  Energy and motivation are good.  Work is going well. She is a Tax adviser and stays very busy.  No extreme sadness, tearfulness, or feelings of hopelessness.  Sleeps well with Sonata. If she doesn't take it, she has trouble falling asleep. Has been waking up in the middle of the night more often.  Too late to take another Sonata.  This happens sometimes and then it passes so she's not concerned.  ADLs and personal hygiene are normal.   Denies any changes in concentration, making decisions, or remembering things.  Appetite has not changed.  Weight is stable.  No reports of anxiety.  Denies suicidal or homicidal thoughts.  Patient denies increased energy with decreased need for sleep, increased talkativeness, racing thoughts, impulsivity or risky behaviors, increased spending, increased libido, grandiosity, increased irritability or anger, paranoia, or hallucinations.  Denies dizziness, syncope, seizures, numbness, tingling, tremor, tics, unsteady gait, slurred speech, confusion. Denies muscle or joint pain, stiffness, or dystonia. Denies unexplained weight loss, frequent infections, or sores that heal slowly.  No polyphagia, polydipsia, or polyuria. Denies visual changes or paresthesias.   Individual Medical History/ Review of Systems: Changes? :Yes low ferritin level and anemia, has seen GI, colonoscopy sched for next month, also saw Hem/Onc.  Past medications for mental health diagnoses include: Effexor XR, Xanax, lithium, mirtazapine caused gain weight, trazodone, Depakote, Latuda, BuSpar, Ambien caused amnesia  Allergies: Patient has no known allergies.  Current Medications:   Current Outpatient Medications:    Vitamin D, Ergocalciferol, (DRISDOL) 1.25 MG (50000 UNIT) CAPS capsule, Take 1 capsule (50,000 Units total) by mouth every 7 (seven) days., Disp: 13 capsule, Rfl: 1   levonorgestrel (MIRENA) 20 MCG/DAY IUD, by Intrauterine route. (Patient not taking: Reported on 03/16/2023), Disp: , Rfl:    lurasidone (LATUDA) 40 MG TABS tablet, Take 1 tablet (40 mg total) by mouth daily with supper., Disp: 30 tablet, Rfl: 5   zaleplon (SONATA) 10 MG capsule, Take 1 capsule (10 mg total) by mouth at bedtime as needed for sleep., Disp: 30 capsule, Rfl: 5 Medication Side Effects: none  Family Medical/ Social History: Changes? none  MENTAL HEALTH EXAM:  There were no vitals taken for this visit.There is no height or weight on file to calculate BMI.  General Appearance: Casual, Neat and Well Groomed  Eye Contact:  Good  Speech:  Clear and Coherent  Volume:  Normal  Mood:  Euthymic  Affect:  Congruent  Thought Process:  Goal Directed and Descriptions of Associations: Circumstantial  Orientation:  Full (Time, Place, and Person)  Thought Content: Logical   Suicidal Thoughts:  No  Homicidal Thoughts:  No  Memory:  WNL  Judgement:  Good  Insight:  Good  Psychomotor Activity:  Normal  Concentration:  Concentration: Good and Attention Span: Good  Recall:  Good  Fund of Knowledge: Good  Language: Good  Assets:  Communication Skills Desire for Improvement Financial Resources/Insurance Housing Transportation Vocational/Educational  ADL's:  Intact  Cognition: WNL  Prognosis:  Good   Labs per PCP 12/17/2022 CBC with differential hemoglobin 9.6, hematocrit 31.8 CMP nl Hemoglobin A1c 6.2 Lipid panel completely normal Other labs were reviewed.   DIAGNOSES:  ICD-10-CM   1. Bipolar I disorder (HCC)  F31.9     2. Insomnia, unspecified type  G47.00     3. Vitamin D deficiency  E55.9      Receiving Psychotherapy: No   RECOMMENDATIONS:  PDMP was reviewed.   Sonata filled 02/22/2023. I provided 32 minutes of face to face time during this encounter, including time spent before and after the visit in records review, medical decision making, counseling pertinent to today's visit, and charting.   She's doing really well so no changes need to be made.   Continue Latuda 40 mg, 1 p.o. q. evening with at least 350 cal. Continue Sonata 10 mg, 1 p.o. nightly as needed sleep.  May repeat 1 for mid nocturnal awakening as long as she has 3 hours left to sleep, as needed. Continue vitamin D per PCP, and follow her, GI, and/or Hem/Onc instructions on any other vitamins.  Return in 6 months.  Melony Overly, PA-C

## 2023-04-09 ENCOUNTER — Encounter: Payer: Self-pay | Admitting: Gastroenterology

## 2023-04-12 ENCOUNTER — Encounter: Payer: Self-pay | Admitting: Internal Medicine

## 2023-04-12 ENCOUNTER — Telehealth: Payer: Self-pay

## 2023-04-12 ENCOUNTER — Other Ambulatory Visit: Payer: Self-pay

## 2023-04-12 DIAGNOSIS — D509 Iron deficiency anemia, unspecified: Secondary | ICD-10-CM

## 2023-04-12 NOTE — Telephone Encounter (Signed)
Prior Authorization/Notification is not required for the requested service(s).  This Surveyor, mining does not currently require a prior authorization for these services. If you have general questions about the prior authorization requirements, please call us at [phone] or visit [email or web address] > Clinician Resources > Advance and Admission Notification Requirements. The number above acknowledges your notification. Please write this number down for future reference. Notification is not a guarantee of coverage or payment.  Decision ID #: [id number]

## 2023-04-16 ENCOUNTER — Ambulatory Visit: Payer: 59 | Admitting: Certified Registered Nurse Anesthetist

## 2023-04-16 ENCOUNTER — Ambulatory Visit
Admission: RE | Admit: 2023-04-16 | Discharge: 2023-04-16 | Disposition: A | Discharge status: Home / Self Care | Payer: 59 | Attending: Gastroenterology | Admitting: Gastroenterology

## 2023-04-16 ENCOUNTER — Encounter
Admission: RE | Disposition: A | Discharge status: Home / Self Care | Payer: Self-pay | Source: Home / Self Care | Attending: Gastroenterology

## 2023-04-16 ENCOUNTER — Encounter: Payer: Self-pay | Admitting: Gastroenterology

## 2023-04-16 DIAGNOSIS — Z9049 Acquired absence of other specified parts of digestive tract: Secondary | ICD-10-CM | POA: Diagnosis not present

## 2023-04-16 DIAGNOSIS — Z980 Intestinal bypass and anastomosis status: Secondary | ICD-10-CM | POA: Diagnosis not present

## 2023-04-16 DIAGNOSIS — D509 Iron deficiency anemia, unspecified: Secondary | ICD-10-CM | POA: Insufficient documentation

## 2023-04-16 DIAGNOSIS — Z9884 Bariatric surgery status: Secondary | ICD-10-CM | POA: Diagnosis not present

## 2023-04-16 HISTORY — PX: ESOPHAGOGASTRODUODENOSCOPY (EGD) WITH PROPOFOL: SHX5813

## 2023-04-16 HISTORY — PX: COLONOSCOPY WITH PROPOFOL: SHX5780

## 2023-04-16 HISTORY — PX: BIOPSY: SHX5522

## 2023-04-16 LAB — POCT PREGNANCY, URINE: Preg Test, Ur: NEGATIVE

## 2023-04-16 SURGERY — COLONOSCOPY WITH PROPOFOL
Anesthesia: General

## 2023-04-16 MED ORDER — DEXMEDETOMIDINE HCL IN NACL 80 MCG/20ML IV SOLN
INTRAVENOUS | Status: DC | PRN
Start: 2023-04-16 — End: 2023-04-16
  Administered 2023-04-16 (×2): 4 ug via INTRAVENOUS

## 2023-04-16 MED ORDER — SODIUM CHLORIDE 0.9 % IV SOLN
INTRAVENOUS | Status: DC
  Administered 2023-04-16: 20 mL/h via INTRAVENOUS

## 2023-04-16 MED ORDER — PROPOFOL 500 MG/50ML IV EMUL
INTRAVENOUS | Status: DC | PRN
  Administered 2023-04-16: 180 ug/kg/min via INTRAVENOUS

## 2023-04-16 MED ORDER — PROPOFOL 10 MG/ML IV BOLUS
INTRAVENOUS | Status: DC | PRN
  Administered 2023-04-16: 50 mg via INTRAVENOUS
  Administered 2023-04-16: 20 mg via INTRAVENOUS

## 2023-04-16 MED ORDER — LIDOCAINE HCL (CARDIAC) PF 100 MG/5ML IV SOSY
PREFILLED_SYRINGE | INTRAVENOUS | Status: DC | PRN
Start: 2023-04-16 — End: 2023-04-16
  Administered 2023-04-16: 60 mg via INTRAVENOUS

## 2023-04-16 NOTE — Transfer of Care (Signed)
Immediate Anesthesia Transfer of Care Note  Patient: Carla Gentry  Procedure(s) Performed: COLONOSCOPY WITH PROPOFOL ESOPHAGOGASTRODUODENOSCOPY (EGD) WITH PROPOFOL  Patient Location: PACU  Anesthesia Type:General  Level of Consciousness: drowsy  Airway & Oxygen Therapy: Patient Spontanous Breathing  Post-op Assessment: Report given to RN and Post -op Vital signs reviewed and stable  Post vital signs: Reviewed and stable  Last Vitals:  Vitals Value Taken Time  BP    Temp    Pulse 77 04/16/23 1048  Resp 19 04/16/23 1048  SpO2 95 % 04/16/23 1048  Vitals shown include unfiled device data.  Last Pain:  Vitals:   04/16/23 1009  TempSrc: Temporal  PainSc: 0-No pain         Complications: No notable events documented.

## 2023-04-16 NOTE — Op Note (Signed)
[facility] Gastroenterology Patient Name: Carla Gentry Procedure Date: 04/16/2023 10:16 AM MRN: [id number] Account #: [id number] Date of Birth: 11-Apr-1981 Admit Type: Outpatient Age: 42 Room: [facility] ENDO ROOM 1 Gender: Female Note Status: Finalized Instrument Name: Prentice Docker [id number] Procedure:             Colonoscopy Indications:           Iron deficiency anemia Providers:             Wyline Mood MD, MD Referring MD:          Dwana Curd. Para March, MD (Referring MD) Medicines:             Monitored Anesthesia Care Complications:         No immediate complications. Procedure:             Pre-Anesthesia Assessment:                        - Prior to the procedure, a History and Physical was                         performed, and patient medications, allergies and                         sensitivities were reviewed. The patient's tolerance                         of previous anesthesia was reviewed.                        - The risks and benefits of the procedure and the                         sedation options and risks were discussed with the                         patient. All questions were answered and informed                         consent was obtained.                        - ASA Grade Assessment: II - A patient with mild                         systemic disease.                        After obtaining informed consent, the colonoscope was                         passed under direct vision. Throughout the procedure,                         the patient's blood pressure, pulse, and oxygen                         saturations were monitored continuously. The                         Colonoscope was introduced through  the anus with the                         intention of advancing to the cecum. The scope was                         advanced to the descending colon before the procedure                         was aborted. Medications were given. The colonoscopy                          was performed with ease. The patient tolerated the                         procedure well. The quality of the bowel preparation                         was inadequate. Findings:      A large amount of semi-liquid stool was found in the sigmoid colon and       in the descending colon, interfering with visualization. Impression:            - Preparation of the colon was inadequate.                        - Stool in the sigmoid colon and in the descending                         colon.                        - No specimens collected. Recommendation:        - Discharge patient to home (with escort).                        - Resume previous diet.                        - Continue present medications.                        - Repeat colonoscopy in 4 weeks because the bowel                         preparation was suboptimal. Procedure Code(s):     --- Professional ---                        [id number], 53, Colonoscopy, flexible; diagnostic,                         including collection of specimen(s) by brushing or                         washing, when performed (separate procedure) Diagnosis Code(s):     --- Professional ---                        D50.9, Iron deficiency anemia, unspecified CPT copyright 2022 American Medical Association. All rights reserved. The codes  documented in this report are preliminary and upon coder review may  be revised to meet current compliance requirements. Wyline Mood, MD Wyline Mood MD, MD 04/16/2023 10:46:16 AM This report has been signed electronically. Number of Addenda: 0 Note Initiated On: 04/16/2023 10:16 AM Scope Withdrawal Time: 0 hours 0 minutes 2 seconds  Total Procedure Duration: 0 hours 1 minute 37 seconds  Estimated Blood Loss:  Estimated blood loss: none.      [facility]

## 2023-04-16 NOTE — Op Note (Signed)
[facility] Gastroenterology Patient Name: Carla Gentry Procedure Date: 04/16/2023 10:17 AM MRN: [id number] Account #: [id number] Date of Birth: 07/04/1980 Admit Type: Outpatient Age: 42 Room: [facility] ENDO ROOM 1 Gender: Female Note Status: Finalized Instrument Name: Patton Salles Endoscope [id number] Procedure:             Upper GI endoscopy Indications:           Iron deficiency anemia Providers:             Wyline Mood MD, MD Referring MD:          Dwana Curd. Para March, MD (Referring MD) Medicines:             Monitored Anesthesia Care Complications:         No immediate complications. Procedure:             Pre-Anesthesia Assessment:                        - Prior to the procedure, a History and Physical was                         performed, and patient medications, allergies and                         sensitivities were reviewed. The patient's tolerance                         of previous anesthesia was reviewed.                        - The risks and benefits of the procedure and the                         sedation options and risks were discussed with the                         patient. All questions were answered and informed                         consent was obtained.                        - ASA Grade Assessment: II - A patient with mild                         systemic disease.                        After obtaining informed consent, the endoscope was                         passed under direct vision. Throughout the procedure,                         the patient's blood pressure, pulse, and oxygen                         saturations were monitored continuously. The Endoscope  was introduced through the mouth, and advanced to the                         jejunum. The upper GI endoscopy was accomplished with                         ease. The patient tolerated the procedure well. Findings:      The examined jejunum was normal. Mucosa was  biopsied with a cold forceps       for histology.      The esophagus was normal.      Evidence of a Roux-en-Y gastrojejunostomy was found. The gastrojejunal       anastomosis was characterized by healthy appearing mucosa. This was       traversed. The pouch-to-jejunum limb was characterized by healthy       appearing mucosa. The jejunojejunal anastomosis was characterized by       healthy appearing mucosa. The excluded stomach was not examined as it       could not be found.      The cardia and gastric fundus were normal on retroflexion. Impression:            - Normal examined jejunum. Biopsied.                        - Normal esophagus.                        - Roux-en-Y gastrojejunostomy with gastrojejunal                         anastomosis characterized by healthy appearing mucosa. Recommendation:        - Await pathology results.                        - Perform a colonoscopy today. Procedure Code(s):     --- Professional ---                        [phone], Esophagogastroduodenoscopy, flexible,                         transoral; with biopsy, single or multiple Diagnosis Code(s):     --- Professional ---                        Z98.0, Intestinal bypass and anastomosis status                        D50.9, Iron deficiency anemia, unspecified CPT copyright 2022 American Medical Association. All rights reserved. The codes documented in this report are preliminary and upon coder review may  be revised to meet current compliance requirements. Wyline Mood, MD Wyline Mood MD, MD 04/16/2023 10:41:37 AM This report has been signed electronically. Number of Addenda: 0 Note Initiated On: 04/16/2023 10:17 AM Estimated Blood Loss:  Estimated blood loss: none.      [facility]

## 2023-04-16 NOTE — Anesthesia Postprocedure Evaluation (Signed)
Anesthesia Post Note  Patient: Carla Gentry  Procedure(s) Performed: COLONOSCOPY WITH PROPOFOL ESOPHAGOGASTRODUODENOSCOPY (EGD) WITH PROPOFOL  Patient location during evaluation: PACU Anesthesia Type: General Level of consciousness: awake and alert Pain management: pain level controlled Vital Signs Assessment: post-procedure vital signs reviewed and stable Respiratory status: spontaneous breathing, nonlabored ventilation, respiratory function stable and patient connected to nasal cannula oxygen Cardiovascular status: blood pressure returned to baseline and stable Postop Assessment: no apparent nausea or vomiting Anesthetic complications: no   No notable events documented.   Last Vitals:  Vitals:   04/16/23 1009 04/16/23 1048  BP: 111/81 (!) 87/61  Pulse: 65   Resp: 20   Temp: (!) 35.9 C (!) 35.7 C  SpO2: 100%     Last Pain:  Vitals:   04/16/23 1058  TempSrc:   PainSc: 0-No pain                 Yevette Edwards

## 2023-04-16 NOTE — Anesthesia Preprocedure Evaluation (Signed)
Anesthesia Evaluation  Patient identified by MRN, date of birth, ID band Patient awake    Reviewed: Allergy & Precautions, H&P , NPO status , Patient's Chart, lab work & pertinent test results, reviewed documented beta blocker date and time   Airway Mallampati: II   Neck ROM: full    Dental  (+) Poor Dentition   Pulmonary neg pulmonary ROS   Pulmonary exam normal        Cardiovascular Exercise Tolerance: Good negative cardio ROS Normal cardiovascular exam Rhythm:regular Rate:Normal     Neuro/Psych  Headaches  Anxiety Depression Bipolar Disorder    Neuromuscular disease  negative psych ROS   GI/Hepatic negative GI ROS, Neg liver ROS,,,  Endo/Other  negative endocrine ROS    Renal/GU negative Renal ROS  negative genitourinary   Musculoskeletal   Abdominal   Peds  Hematology  (+) Blood dyscrasia, anemia   Anesthesia Other Findings Past Medical History: No date: Anemia No date: Anxiety No date: Bipolar disorder (HCC) No date: Depression No date: Migraine No date: Pre-diabetes Past Surgical History: No date: CESAREAN SECTION No date: CHOLECYSTECTOMY No date: GASTRIC BYPASS 03/30/2022: HYSTEROSCOPY; N/A     Comment:  Procedure: HYSTEROSCOPY;  Surgeon: Linzie Collin,               MD;  Location: [facility] ORS;  Service: Gynecology;                Laterality: N/A; 03/30/2022: INTRAUTERINE DEVICE (IUD) INSERTION; N/A     Comment:  Procedure: INTRAUTERINE DEVICE (IUD) INSERTION;                Surgeon: Linzie Collin, MD;  Location: [facility] ORS;                Service: Gynecology;  Laterality: N/A; 03/30/2022: IUD REMOVAL; N/A     Comment:  Procedure: INTRAUTERINE DEVICE (IUD) REMOVAL;  Surgeon:               Linzie Collin, MD;  Location: [facility] ORS;  Service:               Gynecology;  Laterality: N/A; No date: OCCIPITAL NEURECTOMY No date: WISDOM TOOTH EXTRACTION BMI    Body Mass Index: 22.94 kg/m      Reproductive/Obstetrics negative OB ROS                             Anesthesia Physical Anesthesia Plan  ASA: 2  Anesthesia Plan: General   Post-op Pain Management:    Induction:   PONV Risk Score and Plan:   Airway Management Planned:   Additional Equipment:   Intra-op Plan:   Post-operative Plan:   Informed Consent: I have reviewed the patients History and Physical, chart, labs and discussed the procedure including the risks, benefits and alternatives for the proposed anesthesia with the patient or authorized representative who has indicated his/her understanding and acceptance.     Dental Advisory Given  Plan Discussed with: CRNA  Anesthesia Plan Comments:        Anesthesia Quick Evaluation

## 2023-04-16 NOTE — H&P (Signed)
Wyline Mood, MD [address], [place], [place], [address] [address], [place], [place], [address] Phone: [phone]  Fax: [phone]  Primary Care Physician:  Bethanie Dicker, NP   Pre-Procedure History & Physical: HPI:  Carla Gentry is a 42 y.o. female is here for an endoscopy and colonoscopy    Past Medical History:  Diagnosis Date   Anemia    Anxiety    Bipolar disorder (HCC)    Depression    Migraine    Pre-diabetes     Past Surgical History:  Procedure Laterality Date   CESAREAN SECTION     CHOLECYSTECTOMY     GASTRIC BYPASS     HYSTEROSCOPY N/A 03/30/2022   Procedure: HYSTEROSCOPY;  Surgeon: Linzie Collin, MD;  Location: [facility] ORS;  Service: Gynecology;  Laterality: N/A;   INTRAUTERINE DEVICE (IUD) INSERTION N/A 03/30/2022   Procedure: INTRAUTERINE DEVICE (IUD) INSERTION;  Surgeon: Linzie Collin, MD;  Location: [facility] ORS;  Service: Gynecology;  Laterality: N/A;   IUD REMOVAL N/A 03/30/2022   Procedure: INTRAUTERINE DEVICE (IUD) REMOVAL;  Surgeon: Linzie Collin, MD;  Location: [facility] ORS;  Service: Gynecology;  Laterality: N/A;   OCCIPITAL NEURECTOMY     WISDOM TOOTH EXTRACTION      Prior to Admission medications   Medication Sig Start Date End Date Taking? Authorizing Provider  lurasidone (LATUDA) 40 MG TABS tablet Take 1 tablet (40 mg total) by mouth daily with supper. 03/16/23  Yes Hurst, Rosey Bath T, PA-C  Vitamin D, Ergocalciferol, (DRISDOL) 1.25 MG (50000 UNIT) CAPS capsule Take 1 capsule (50,000 Units total) by mouth every 7 (seven) days. 12/22/22  Yes Bethanie Dicker, NP  zaleplon (SONATA) 10 MG capsule Take 1 capsule (10 mg total) by mouth at bedtime as needed for sleep. 03/16/23  Yes Cherie Ouch, PA-C  levonorgestrel (MIRENA) 20 MCG/DAY IUD by Intrauterine route. Patient not taking: Reported on 03/16/2023    [provider]    Allergies as of 02/03/2023   (No Known Allergies)    Family History  Problem  Relation Age of Onset   Hypertension Father    Depression Maternal Grandfather    Cancer Paternal Grandmother     Social History   Socioeconomic History   Marital status: Married    Spouse name: Gregary Signs   Number of children: 1   Years of education: Not on file   Highest education level: Not on file  Occupational History   Not on file  Tobacco Use   Smoking status: Never   Smokeless tobacco: Never  Vaping Use   Vaping status: Never Used  Substance and Sexual Activity   Alcohol use: Not Currently    Comment: rarely   Drug use: Never   Sexual activity: Yes    Birth control/protection: I.U.D.  Other Topics Concern   Not on file  Social History Narrative   Not on file   Social Determinants of Health   Financial Resource Strain: Not on file  Food Insecurity: No Food Insecurity (12/28/2022)   Hunger Vital Sign    Worried About Running Out of Food in the Last Year: Never true    Ran Out of Food in the Last Year: Never true  Transportation Needs: No Transportation Needs (12/28/2022)   PRAPARE - Administrator, Civil Service (Medical): No    Lack of Transportation (Non-Medical): No  Physical Activity: Not on file  Stress: Not on file  Social Connections: Unknown (10/31/2021)  [facility]   Social Network    Social Network: Not on file  Intimate Partner Violence: Not At Risk (12/28/2022)   Humiliation, Afraid, Rape, and Kick questionnaire    Fear of Current or Ex-Partner: No    Emotionally Abused: No    Physically Abused: No    Sexually Abused: No    Review of Systems: See HPI, otherwise negative ROS  Physical Exam: BP 111/81   Pulse 65   Temp (!) 96.7 F (35.9 C) (Temporal)   Resp 20   Ht 5\' 1"  (1.549 m)   Wt 55.1 kg   SpO2 100%   BMI 22.94 kg/m  General:   Alert,  pleasant and cooperative in NAD Head:  Normocephalic and atraumatic. Neck:  Supple; no masses or thyromegaly. Lungs:  Clear throughout to auscultation, normal  respiratory effort.    Heart:  +S1, +S2, Regular rate and rhythm, No edema. Abdomen:  Soft, nontender and nondistended. Normal bowel sounds, without guarding, and without rebound.   Neurologic:  Alert and  oriented x4;  grossly normal neurologically.  Impression/Plan: Carla Gentry is here for an endoscopy and colonoscopy  to be performed for  evaluation of iron deficiency anemia    Risks, benefits, limitations, and alternatives regarding endoscopy have been reviewed with the patient.  Questions have been answered.  All parties agreeable.   Wyline Mood, MD  04/16/2023, 10:28 AM

## 2023-04-17 NOTE — Progress Notes (Signed)
Voicemail.  No Message Left. 

## 2023-04-18 NOTE — Plan of Care (Signed)
[facility] Tonsillectomy/Adenoidectomy, Postoperative PEDS care plan entered in error.

## 2023-04-19 LAB — SURGICAL PATHOLOGY

## 2023-04-20 ENCOUNTER — Encounter: Payer: Self-pay | Admitting: Gastroenterology

## 2023-04-30 ENCOUNTER — Inpatient Hospital Stay: Payer: 59 | Attending: Internal Medicine

## 2023-04-30 ENCOUNTER — Inpatient Hospital Stay: Payer: 59 | Admitting: Internal Medicine

## 2023-04-30 VITALS — BP 108/83 | HR 59 | Temp 97.8°F | Wt 122.0 lb

## 2023-04-30 DIAGNOSIS — E538 Deficiency of other specified B group vitamins: Secondary | ICD-10-CM | POA: Insufficient documentation

## 2023-04-30 DIAGNOSIS — Z9884 Bariatric surgery status: Secondary | ICD-10-CM

## 2023-04-30 DIAGNOSIS — D509 Iron deficiency anemia, unspecified: Secondary | ICD-10-CM | POA: Insufficient documentation

## 2023-04-30 DIAGNOSIS — D508 Other iron deficiency anemias: Secondary | ICD-10-CM | POA: Diagnosis not present

## 2023-04-30 DIAGNOSIS — Z9889 Other specified postprocedural states: Secondary | ICD-10-CM

## 2023-04-30 LAB — IRON AND TIBC
Iron: 74 ug/dL (ref 28–170)
Saturation Ratios: 16 % (ref 10.4–31.8)
TIBC: 455 ug/dL — ABNORMAL HIGH (ref 250–450)
UIBC: 381 ug/dL

## 2023-04-30 LAB — CBC WITH DIFFERENTIAL (CANCER CENTER ONLY)
Abs Immature Granulocytes: 0.02 10*3/uL (ref 0.00–0.07)
Basophils Absolute: 0.1 10*3/uL (ref 0.0–0.1)
Basophils Relative: 1 %
Eosinophils Absolute: 0.3 10*3/uL (ref 0.0–0.5)
Eosinophils Relative: 5 %
HCT: 41.2 % (ref 36.0–46.0)
Hemoglobin: 13.8 g/dL (ref 12.0–15.0)
Immature Granulocytes: 0 %
Lymphocytes Relative: 33 %
Lymphs Abs: 2.5 10*3/uL (ref 0.7–4.0)
MCH: 30 pg (ref 26.0–34.0)
MCHC: 33.5 g/dL (ref 30.0–36.0)
MCV: 89.6 fL (ref 80.0–100.0)
Monocytes Absolute: 0.7 10*3/uL (ref 0.1–1.0)
Monocytes Relative: 9 %
Neutro Abs: 4 10*3/uL (ref 1.7–7.7)
Neutrophils Relative %: 52 %
Platelet Count: 343 10*3/uL (ref 150–400)
RBC: 4.6 MIL/uL (ref 3.87–5.11)
RDW: 13.2 % (ref 11.5–15.5)
WBC Count: 7.6 10*3/uL (ref 4.0–10.5)
nRBC: 0 % (ref 0.0–0.2)

## 2023-04-30 LAB — FOLATE: Folate: 23.4 ng/mL (ref >5.9)

## 2023-04-30 LAB — FERRITIN: Ferritin: 11 ng/mL (ref 11–307)

## 2023-04-30 LAB — VITAMIN B12: Vitamin B-12: 137 pg/mL — ABNORMAL LOW (ref 180–914)

## 2023-04-30 NOTE — Progress Notes (Signed)
04/16/2023 she had a colonoscopy and endoscopy

## 2023-04-30 NOTE — Progress Notes (Signed)
[facility]  [phone] [phone]  ID: Carla Gentry OB: Dec 03, 1980  MR#: [id number]  AOZ#:[id number]  Patient Care Team: Bethanie Dicker, NP as PCP - General (Nurse Practitioner) Michaelyn Barter, MD as Consulting Physician (Oncology)  REFERRING PROVIDER: Bethanie Dicker  REASON FOR REFERRAL: Iron deficiency anemia  HPI: Carla Gentry is a 42 y.o. female with past medical history of migraine, anemia, depression was referred to hematology for management of iron deficiency anemia.  On lab review, patient has anemia at least since 2020.  She has IUD in place so does not get have heavy periods.  Denies any bleeding in urine or stool.  Has history of gastric bypass surgery in 2010.  Never had colonoscopy.  Had endoscopy prior to his surgery in 2010.  Occasional NSAID use.  Energy level is good.  Denies any shortness of breath, chest pain, dizziness, pica. She has been referred to GI by her PCP.   Interval history Patient was seen today as follow-up for iron deficiency anemia and labs. Completed IV Venofer 300 mg weekly x 3 doses in July 2024.  She did not feel much difference because she felt fine even before it.  Could not complete colonoscopy due to suboptimal prep.  Is not interested in rescheduling it as it cost at her $1500.  Endoscopy was negative.  She has IUD in place does not have periods.  REVIEW OF SYSTEMS:   ROS  As per HPI. Otherwise, a complete review of systems is negative.  PAST MEDICAL HISTORY: Past Medical History:  Diagnosis Date   Anemia    Anxiety    Bipolar disorder (HCC)    Depression    Migraine    Pre-diabetes     PAST SURGICAL HISTORY: Past Surgical History:  Procedure Laterality Date   BIOPSY  04/16/2023   Procedure: BIOPSY;  Surgeon: Wyline Mood, MD;  Location: [facility] ENDOSCOPY;  Service: Gastroenterology;;   CESAREAN SECTION     CHOLECYSTECTOMY     COLONOSCOPY WITH PROPOFOL N/A 04/16/2023   Procedure:  COLONOSCOPY WITH PROPOFOL;  Surgeon: Wyline Mood, MD;  Location: [facility] ENDOSCOPY;  Service: Gastroenterology;  Laterality: N/A;   ESOPHAGOGASTRODUODENOSCOPY (EGD) WITH PROPOFOL N/A 04/16/2023   Procedure: ESOPHAGOGASTRODUODENOSCOPY (EGD) WITH PROPOFOL;  Surgeon: Wyline Mood, MD;  Location[facility] END[facility]COPY;  Service: Gastroenterology;  Laterality: N/A;   GASTRIC BYPASS     HYSTEROSCOPY N/A 03/30/2022   Procedure: HYSTEROSCOPY;  Surgeon: Linzie Collin, MD;  Location: [facility] ORS;  Service: Gynecology;  Laterality: N/A;   INTRAUTERINE DEVICE (IUD) INSERTION N/A 03/30/2022   Procedure: INTRAUTERINE DEVICE (IUD) INSERTION;  Surgeon: Linzie Collin, MD;  Location: [facility] ORS;  Service: Gynecology;  Laterality: N/A;   IUD REMOVAL N/A 03/30/2022   Procedure: INTRAUTERINE DEVICE (IUD) REMOVAL;  Surgeon: Linzie Collin, MD;  Location: [facility] ORS;  Service: Gynecology;  Laterality: N/A;   OCCIPITAL NEURECTOMY     WISDOM TOOTH EXTRACTION      FAMILY HISTORY: Family History  Problem Relation Age of Onset   Hypertension Father    Depression Maternal Grandfather    Cancer Paternal Grandmother     HEALTH MAINTENANCE: Social History   Tobacco Use   Smoking status: Never   Smokeless tobacco: Never  Vaping Use   Vaping status: Never Used  Substance Use Topics   Alcohol use: Not Currently    Comment: rarely   Drug use: Never     No Known Allergies  Current Outpatient Medications  Medication Sig Dispense Refill  levonorgestrel (MIRENA) 20 MCG/DAY IUD by Intrauterine route.     lurasidone (LATUDA) 40 MG TABS tablet Take 1 tablet (40 mg total) by mouth daily with supper. 30 tablet 5   Vitamin D, Ergocalciferol, (DRISDOL) 1.25 MG (50000 UNIT) CAPS capsule Take 1 capsule (50,000 Units total) by mouth every 7 (seven) days. 13 capsule 1   zaleplon (SONATA) 10 MG capsule Take 1 capsule (10 mg total) by mouth at bedtime as needed for sleep. 30 capsule 5   No current facility-administered  medications for this visit.    OBJECTIVE: Vitals:   04/30/23 1525  BP: 108/83  Pulse: (!) 59  Temp: 97.8 F (36.6 C)  SpO2: 100%      Body mass index is 23.05 kg/m.      Physical exam not performed.  No specific concerns endorsed.   LAB RESULTS:  Lab Results  Component Value Date   NA 138 12/17/2022   K 3.7 12/17/2022   CL 104 12/17/2022   CO2 26 12/17/2022   GLUCOSE 84 12/17/2022   BUN 17 12/17/2022   CREATININE 0.68 12/17/2022   CALCIUM 9.3 12/17/2022   PROT 7.1 12/17/2022   ALBUMIN 4.2 12/17/2022   AST 20 12/17/2022   ALT 11 12/17/2022   ALKPHOS 59 12/17/2022   BILITOT 0.6 12/17/2022   GFRNONAA 102 03/24/2019   GFRAA 118 03/24/2019    Lab Results  Component Value Date   WBC 7.6 04/30/2023   NEUTROABS 4.0 04/30/2023   HGB 13.8 04/30/2023   HCT 41.2 04/30/2023   MCV 89.6 04/30/2023   PLT 343 04/30/2023    Lab Results  Component Value Date   TIBC 547.4 (H) 12/17/2022   FERRITIN 3.1 (L) 12/17/2022   IRONPCTSAT 4.7 (L) 12/17/2022     STUDIES: No results found.  ASSESSMENT AND PLAN:   Carla Gentry is a 42 y.o. female with pmh of migraine, anemia, depression was referred to hematology for management of iron deficiency anemia.  # Iron deficiency anemia # History of gastric bypass surgery in 2010 -Present at least since 2020. 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  - Colonoscopy from 04/16/2023 with Dr. Tobi Bastos.  Prep was suboptimal.  Upper endoscopy was negative.  Patient not interested in rescheduling colonoscopy due to cost issues.  -Hemoglobin 9.6.  Ferritin 3.  Completed IV Venofer 300 mg weekly x 3 doses in July 2024. -Hemoglobin today has significantly improved from 9.6-13.4.  Iron panel is pending.  If her ferritin is still low, we will schedule her for iron infusions.  -Vitamin B12 level monitoring with history of gastric bypass surgery.  Level pending today.   Orders Placed This Encounter  Procedures   CBC with  Differential (Cancer Center Only)   Ferritin   Iron and TIBC   Folate   Vitamin B12   RTC in 6 months for MD visit, labs  Patient expressed understanding and was in agreement with this plan. She also understands that She can call clinic at any time with any questions, concerns, or complaints.   I spent a total of 25 minutes reviewing chart data, face-to-face evaluation with the patient, counseling and coordination of care as detailed above.  Michaelyn Barter, MD   04/30/2023 3:39 PM

## 2023-05-05 ENCOUNTER — Encounter: Payer: Self-pay | Admitting: *Deleted

## 2023-05-05 ENCOUNTER — Encounter: Payer: Self-pay | Admitting: Internal Medicine

## 2023-05-05 NOTE — Addendum Note (Signed)
Addended byMichaelyn Barter on: 05/05/2023 10:17 AM   Modules accepted: Orders

## 2023-05-10 ENCOUNTER — Inpatient Hospital Stay: Payer: 59

## 2023-05-10 DIAGNOSIS — D509 Iron deficiency anemia, unspecified: Secondary | ICD-10-CM | POA: Diagnosis not present

## 2023-05-10 DIAGNOSIS — D508 Other iron deficiency anemias: Secondary | ICD-10-CM

## 2023-05-10 MED ORDER — CYANOCOBALAMIN 1000 MCG/ML IJ SOLN
1000.0000 ug | Freq: Once | INTRAMUSCULAR | Status: AC
  Administered 2023-05-10: 1000 ug via INTRAMUSCULAR
  Filled 2023-05-10: qty 1

## 2023-05-10 MED ORDER — IRON SUCROSE 20 MG/ML IV SOLN
200.0000 mg | Freq: Once | INTRAVENOUS | Status: AC
  Administered 2023-05-10: 200 mg via INTRAVENOUS

## 2023-05-10 MED ORDER — SODIUM CHLORIDE 0.9% FLUSH
10.0000 mL | Freq: Two times a day (BID) | INTRAVENOUS | Status: DC
  Filled 2023-05-10: qty 10

## 2023-05-10 NOTE — Patient Instructions (Signed)
Iron Sucrose Injection What is this medication? IRON SUCROSE (EYE ern SOO krose) treats low levels of iron (iron deficiency anemia) in people with kidney disease. Iron is a mineral that plays an important role in making red blood cells, which carry oxygen from your lungs to the rest of your body. This medicine may be used for other purposes; ask your health care provider or pharmacist if you have questions. COMMON BRAND NAME(S): Venofer What should I tell my care team before I take this medication? They need to know if you have any of these conditions: Anemia not caused by low iron levels Heart disease High levels of iron in the blood Kidney disease Liver disease An unusual or allergic reaction to iron, other medications, foods, dyes, or preservatives Pregnant or trying to get pregnant Breastfeeding How should I use this medication? This medication is for infusion into a vein. It is given in a hospital or clinic setting. Talk to your care team about the use of this medication in children. While this medication may be prescribed for children as young as 2 years for selected conditions, precautions do apply. Overdosage: If you think you have taken too much of this medicine contact a poison control center or emergency room at once. NOTE: This medicine is only for you. Do not share this medicine with others. What if I miss a dose? Keep appointments for follow-up doses. It is important not to miss your dose. Call your care team if you are unable to keep an appointment. What may interact with this medication? Do not take this medication with any of the following: Deferoxamine Dimercaprol Other iron products This medication may also interact with the following: Chloramphenicol Deferasirox This list may not describe all possible interactions. Give your health care provider a list of all the medicines, herbs, non-prescription drugs, or dietary supplements you use. Also tell them if you smoke,  drink alcohol, or use illegal drugs. Some items may interact with your medicine. What should I watch for while using this medication? Visit your care team regularly. Tell your care team if your symptoms do not start to get better or if they get worse. You may need blood work done while you are taking this medication. You may need to follow a special diet. Talk to your care team. Foods that contain iron include: whole grains/cereals, dried fruits, beans, or peas, leafy green vegetables, and organ meats (liver, kidney). What side effects may I notice from receiving this medication? Side effects that you should report to your care team as soon as possible: Allergic reactions--skin rash, itching, hives, swelling of the face, lips, tongue, or throat Low blood pressure--dizziness, feeling faint or lightheaded, blurry vision Shortness of breath Side effects that usually do not require medical attention (report to your care team if they continue or are bothersome): Flushing Headache Joint pain Muscle pain Nausea Pain, redness, or irritation at injection site This list may not describe all possible side effects. Call your doctor for medical advice about side effects. You may report side effects to FDA at 1-800-FDA-1088. Where should I keep my medication? This medication is given in a hospital or clinic. It will not be stored at home. NOTE: This sheet is a summary. It may not cover all possible information. If you have questions about this medicine, talk to your doctor, pharmacist, or health care provider.  2024 Elsevier/Gold Standard (2022-11-20 00:00:00)  Vitamin B12 Injection What is this medication? Vitamin B12 (VAHY tuh min B12) prevents and treats low  vitamin B12 levels in your body. It is used in people who do not get enough vitamin B12 from their diet or when their digestive tract does not absorb enough. Vitamin B12 plays an important role in maintaining the health of your nervous system and  red blood cells. This medicine may be used for other purposes; ask your health care provider or pharmacist if you have questions. COMMON BRAND NAME(S): B-12 Compliance Kit, B-12 Injection Kit, Cyomin, Dodex, LA-12, Nutri-Twelve, Physicians EZ Use B-12, Primabalt, Vitamin Deficiency Injectable System - B12 What should I tell my care team before I take this medication? They need to know if you have any of these conditions: Kidney disease Leber's disease Megaloblastic anemia An unusual or allergic reaction to cyanocobalamin, cobalt, other medications, foods, dyes, or preservatives Pregnant or trying to get pregnant Breast-feeding How should I use this medication? This medication is injected into a muscle or deeply under the skin. It is usually given in a clinic or care team's office. However, your care team may teach you how to inject yourself. Follow all instructions. Talk to your care team about the use of this medication in children. Special care may be needed. Overdosage: If you think you have taken too much of this medicine contact a poison control center or emergency room at once. NOTE: This medicine is only for you. Do not share this medicine with others. What if I miss a dose? If you are given your dose at a clinic or care team's office, call to reschedule your appointment. If you give your own injections, and you miss a dose, take it as soon as you can. If it is almost time for your next dose, take only that dose. Do not take double or extra doses. What may interact with this medication? Alcohol Colchicine This list may not describe all possible interactions. Give your health care provider a list of all the medicines, herbs, non-prescription drugs, or dietary supplements you use. Also tell them if you smoke, drink alcohol, or use illegal drugs. Some items may interact with your medicine. What should I watch for while using this medication? Visit your care team regularly. You may need  blood work done while you are taking this medication. You may need to follow a special diet. Talk to your care team. Limit your alcohol intake and avoid smoking to get the best benefit. What side effects may I notice from receiving this medication? Side effects that you should report to your care team as soon as possible: Allergic reactions--skin rash, itching, hives, swelling of the face, lips, tongue, or throat Swelling of the ankles, hands, or feet Trouble breathing Side effects that usually do not require medical attention (report to your care team if they continue or are bothersome): Diarrhea This list may not describe all possible side effects. Call your doctor for medical advice about side effects. You may report side effects to FDA at 1-800-FDA-1088. Where should I keep my medication? Keep out of the reach of children. Store at room temperature between 15 and 30 degrees C (59 and 85 degrees F). Protect from light. Throw away any unused medication after the expiration date. NOTE: This sheet is a summary. It may not cover all possible information. If you have questions about this medicine, talk to your doctor, pharmacist, or health care provider.  2024 Elsevier/Gold Standard (2021-02-25 00:00:00)

## 2023-05-17 ENCOUNTER — Inpatient Hospital Stay: Payer: 59

## 2023-05-17 VITALS — BP 126/90 | HR 55 | Resp 16

## 2023-05-17 DIAGNOSIS — D508 Other iron deficiency anemias: Secondary | ICD-10-CM

## 2023-05-17 DIAGNOSIS — D509 Iron deficiency anemia, unspecified: Secondary | ICD-10-CM | POA: Diagnosis not present

## 2023-05-17 MED ORDER — CYANOCOBALAMIN 1000 MCG/ML IJ SOLN
1000.0000 ug | Freq: Once | INTRAMUSCULAR | Status: AC
  Administered 2023-05-17: 1000 ug via INTRAMUSCULAR
  Filled 2023-05-17: qty 1

## 2023-05-17 MED ORDER — IRON SUCROSE 20 MG/ML IV SOLN
200.0000 mg | Freq: Once | INTRAVENOUS | Status: AC
  Administered 2023-05-17: 200 mg via INTRAVENOUS
  Filled 2023-05-17: qty 10

## 2023-05-19 ENCOUNTER — Telehealth: Payer: Self-pay

## 2023-05-19 NOTE — Telephone Encounter (Signed)
Called patient and left her a voicemail letting her know that she needs to give [place] a call to schedule a colonoscopy again because she was not cleaned out on her last (04/16/2023). Dr. Tobi Bastos would want to have it done within this year.

## 2023-05-21 ENCOUNTER — Telehealth: Payer: Self-pay

## 2023-05-21 NOTE — Telephone Encounter (Signed)
Left message for patient to return call to office so we can schedule colonoscopy due to not being cleaned out.   Called patient and left her a voicemail letting her know that she needs to give [place] a call to schedule a colonoscopy again because she was not cleaned out on her last (04/16/2023). Dr. Tobi Bastos would want to have it done within this year.

## 2023-05-24 ENCOUNTER — Telehealth: Payer: Self-pay

## 2023-05-24 ENCOUNTER — Inpatient Hospital Stay: Payer: 59

## 2023-05-24 DIAGNOSIS — D509 Iron deficiency anemia, unspecified: Secondary | ICD-10-CM | POA: Diagnosis not present

## 2023-05-24 DIAGNOSIS — D508 Other iron deficiency anemias: Secondary | ICD-10-CM

## 2023-05-24 MED ORDER — CYANOCOBALAMIN 1000 MCG/ML IJ SOLN
1000.0000 ug | Freq: Once | INTRAMUSCULAR | Status: AC
  Administered 2023-05-24: 1000 ug via INTRAMUSCULAR
  Filled 2023-05-24: qty 1

## 2023-05-24 NOTE — Telephone Encounter (Signed)
Left detailed message letting patient know we need to schedule repeat colonoscopy. Left message last week also.

## 2023-05-31 ENCOUNTER — Inpatient Hospital Stay: Payer: 59 | Attending: Internal Medicine

## 2023-05-31 DIAGNOSIS — E538 Deficiency of other specified B group vitamins: Secondary | ICD-10-CM | POA: Diagnosis present

## 2023-05-31 DIAGNOSIS — D508 Other iron deficiency anemias: Secondary | ICD-10-CM

## 2023-05-31 MED ORDER — CYANOCOBALAMIN 1000 MCG/ML IJ SOLN
1000.0000 ug | Freq: Once | INTRAMUSCULAR | Status: AC
Start: 2023-05-31 — End: 2023-05-31
  Administered 2023-05-31: 1000 ug via INTRAMUSCULAR
  Filled 2023-05-31: qty 1

## 2023-06-01 NOTE — Telephone Encounter (Signed)
Please follow up telephone call from [facility], CMA from 05/21/2023 and 05/24/2023.

## 2023-06-17 ENCOUNTER — Other Ambulatory Visit: Payer: Self-pay | Admitting: Nurse Practitioner

## 2023-06-17 DIAGNOSIS — E559 Vitamin D deficiency, unspecified: Secondary | ICD-10-CM

## 2023-09-14 ENCOUNTER — Ambulatory Visit: Payer: 59 | Admitting: Physician Assistant

## 2023-09-14 ENCOUNTER — Telehealth: Payer: Self-pay | Admitting: Physician Assistant

## 2023-09-14 ENCOUNTER — Other Ambulatory Visit: Payer: Self-pay

## 2023-09-14 MED ORDER — LURASIDONE HCL 40 MG PO TABS: 40.0000 mg | ORAL_TABLET | Freq: Every day | ORAL | 5 refills | Status: DC

## 2023-09-14 NOTE — Telephone Encounter (Signed)
 RF for Latuda sent. Sonata LF 2/21, due 3/21

## 2023-09-14 NOTE — Telephone Encounter (Signed)
 Pt RS to 5/15 provider out. Requesting Rx for Latuda and Kathaleen Bury to [facility] Dr Nicholes Rough out of refills.

## 2023-09-17 ENCOUNTER — Other Ambulatory Visit: Payer: Self-pay

## 2023-09-17 MED ORDER — ZALEPLON 10 MG PO CAPS: 10.0000 mg | ORAL_CAPSULE | Freq: Every evening | ORAL | 1 refills | Status: DC | PRN

## 2023-09-17 NOTE — Telephone Encounter (Signed)
 Pended Sonata to [facility]

## 2023-09-20 NOTE — Telephone Encounter (Signed)
 Marland Kitchen

## 2023-10-16 ENCOUNTER — Other Ambulatory Visit: Payer: Self-pay | Admitting: Physician Assistant

## 2023-10-28 ENCOUNTER — Inpatient Hospital Stay (HOSPITAL_BASED_OUTPATIENT_CLINIC_OR_DEPARTMENT_OTHER): Payer: 59 | Admitting: Internal Medicine

## 2023-10-28 ENCOUNTER — Encounter: Payer: Self-pay | Admitting: Internal Medicine

## 2023-10-28 ENCOUNTER — Inpatient Hospital Stay: Payer: 59 | Attending: Internal Medicine

## 2023-10-28 VITALS — BP 110/70 | HR 63 | Temp 98.5°F | Resp 18 | Wt 119.0 lb

## 2023-10-28 DIAGNOSIS — Z9884 Bariatric surgery status: Secondary | ICD-10-CM | POA: Insufficient documentation

## 2023-10-28 DIAGNOSIS — R42 Dizziness and giddiness: Secondary | ICD-10-CM | POA: Insufficient documentation

## 2023-10-28 DIAGNOSIS — D509 Iron deficiency anemia, unspecified: Secondary | ICD-10-CM

## 2023-10-28 DIAGNOSIS — D508 Other iron deficiency anemias: Secondary | ICD-10-CM

## 2023-10-28 LAB — CBC WITH DIFFERENTIAL (CANCER CENTER ONLY)
Abs Immature Granulocytes: 0.02 10*3/uL (ref 0.00–0.07)
Basophils Absolute: 0.1 10*3/uL (ref 0.0–0.1)
Basophils Relative: 1 %
Eosinophils Absolute: 0.3 10*3/uL (ref 0.0–0.5)
Eosinophils Relative: 5 %
HCT: 40.8 % (ref 36.0–46.0)
Hemoglobin: 13.9 g/dL (ref 12.0–15.0)
Immature Granulocytes: 0 %
Lymphocytes Relative: 33 %
Lymphs Abs: 2.3 10*3/uL (ref 0.7–4.0)
MCH: 30.5 pg (ref 26.0–34.0)
MCHC: 34.1 g/dL (ref 30.0–36.0)
MCV: 89.7 fL (ref 80.0–100.0)
Monocytes Absolute: 0.6 10*3/uL (ref 0.1–1.0)
Monocytes Relative: 8 %
Neutro Abs: 3.6 10*3/uL (ref 1.7–7.7)
Neutrophils Relative %: 53 %
Platelet Count: 311 10*3/uL (ref 150–400)
RBC: 4.55 MIL/uL (ref 3.87–5.11)
RDW: 11.9 % (ref 11.5–15.5)
WBC Count: 6.9 10*3/uL (ref 4.0–10.5)
nRBC: 0 % (ref 0.0–0.2)

## 2023-10-28 LAB — IRON AND TIBC
Iron: 84 ug/dL (ref 28–170)
Saturation Ratios: 25 % (ref 10.4–31.8)
TIBC: 340 ug/dL (ref 250–450)
UIBC: 256 ug/dL

## 2023-10-28 LAB — FERRITIN: Ferritin: 94 ng/mL (ref 11–307)

## 2023-10-28 NOTE — Progress Notes (Signed)
 Patient has been having some lightheadedness for about a month now. She said that she can just be sitting at her desk and all of a sudden she will start feeling light headed, doesn't last long, it isn't everyday, maybe a couple of times a week.

## 2023-10-28 NOTE — Progress Notes (Signed)
 [facility]  [phone] [phone]) [phone]  ID: Carla Gentry OB: 1980/10/24  MR#: [id number]  AOZ#:[id number]  Patient Care Team: Bluford Burkitt, NP as PCP - General (Nurse Practitioner) Loreatha Rodney, MD as Consulting Physician (Oncology)  REFERRING PROVIDER: Bluford Burkitt  REASON FOR REFERRAL: Iron  deficiency anemia  HPI: Carla Gentry is a 43 y.o. female with past medical history of migraine, anemia, depression was referred to hematology for management of iron  deficiency anemia.  On lab review, patient has anemia at least since 2020.  She has IUD in place so does not get have heavy periods.  Denies any bleeding in urine or stool.  Has history of gastric bypass surgery in 2010.  Never had colonoscopy.  Had endoscopy prior to his surgery in 2010.  Occasional NSAID use.  Completed IV Venofer  in November 2025.  Interval history Patient was seen today as follow-up for iron  deficiency anemia and labs. She is doing well overall.  Her symptom today is the lightheadedness can happen anytime.  Not associated with other symptoms such as shortness of breath, chest pain, nausea.  REVIEW OF SYSTEMS:   ROS  As per HPI. Otherwise, a complete review of systems is negative.  PAST MEDICAL HISTORY: Past Medical History:  Diagnosis Date   Anemia    Anxiety    Bipolar disorder (HCC)    Depression    Migraine    Pre-diabetes     PAST SURGICAL HISTORY: Past Surgical History:  Procedure Laterality Date   BIOPSY  04/16/2023   Procedure: BIOPSY;  Surgeon: Luke Salaam, MD;  Location: [facility] ENDOSCOPY;  Service: Gastroenterology;;   CESAREAN SECTION     CHOLECYSTECTOMY     COLONOSCOPY WITH PROPOFOL  N/A 04/16/2023   Procedure: COLONOSCOPY WITH PROPOFOL ;  Surgeon: Luke Salaam, MD;  Location: [facility] ENDOSCOPY;  Service: Gastroenterology;  Laterality: N/A;   ESOPHAGOGASTRODUODENOSCOPY (EGD) WITH PROPOFOL  N/A 04/16/2023   Procedure: ESOPHAGOGASTRODUODENOSCOPY  (EGD) WITH PROPOFOL ;  Surgeon: Luke Salaam, MD;  Location: [facility] ENDOSCOPY;  Service: Gastroenterology;  Laterality: N/A;   GASTRIC BYPASS     HYSTEROSCOPY N/A 03/30/2022   Procedure: HYSTEROSCOPY;  Surgeon: Zenobia Hila, MD;  Location: [facility] ORS;  Service: Gynecology;  Laterality: N/A;   INTRAUTERINE DEVICE (IUD) INSERTION N/A 03/30/2022   Procedure: INTRAUTERINE DEVICE (IUD) INSERTION;  Surgeon: Zenobia Hila, MD;  Location: [facility] ORS;  Service: Gynecology;  Laterality: N/A;   IUD REMOVAL N/A 03/30/2022   Procedure: INTRAUTERINE DEVICE (IUD) REMOVAL;  Surgeon: Zenobia Hila, MD;  Location: [facility] ORS;  Service: Gynecology;  Laterality: N/A;   OCCIPITAL NEURECTOMY     WISDOM TOOTH EXTRACTION      FAMILY HISTORY: Family History  Problem Relation Age of Onset   Hypertension Father    Depression Maternal Grandfather    Cancer Paternal Grandmother     HEALTH MAINTENANCE: Social History   Tobacco Use   Smoking status: Never   Smokeless tobacco: Never  Vaping Use   Vaping status: Never Used  Substance Use Topics   Alcohol use: Not Currently    Comment: rarely   Drug use: Never     No Known Allergies  Current Outpatient Medications  Medication Sig Dispense Refill   levonorgestrel  (MIRENA ) 20 MCG/DAY IUD by Intrauterine route.     lurasidone  (LATUDA ) 40 MG TABS tablet Take 1 tablet (40 mg total) by mouth daily with supper. 30 tablet 5   zaleplon  (SONATA ) 10 MG capsule Take 1 capsule (10 mg total) by mouth at bedtime  as needed for sleep. 30 capsule 1   No current facility-administered medications for this visit.    OBJECTIVE: Vitals:   10/28/23 1530  BP: 110/70  Pulse: 63  Resp: 18  Temp: 98.5 F (36.9 C)  SpO2: 100%      Body mass index is 22.48 kg/m.      Physical exam not performed.  No specific concerns endorsed.   LAB RESULTS:  Lab Results  Component Value Date   NA 138 12/17/2022   K 3.7 12/17/2022   CL 104 12/17/2022   CO2 26 12/17/2022    GLUCOSE 84 12/17/2022   BUN 17 12/17/2022   CREATININE 0.68 12/17/2022   CALCIUM 9.3 12/17/2022   PROT 7.1 12/17/2022   ALBUMIN 4.2 12/17/2022   AST 20 12/17/2022   ALT 11 12/17/2022   ALKPHOS 59 12/17/2022   BILITOT 0.6 12/17/2022   GFRNONAA 102 03/24/2019   GFRAA 118 03/24/2019    Lab Results  Component Value Date   WBC 6.9 10/28/2023   NEUTROABS 3.6 10/28/2023   HGB 13.9 10/28/2023   HCT 40.8 10/28/2023   MCV 89.7 10/28/2023   PLT 311 10/28/2023    Lab Results  Component Value Date   TIBC 455 (H) 04/30/2023   TIBC 547.4 (H) 12/17/2022   FERRITIN 11 04/30/2023   FERRITIN 3.1 (L) 12/17/2022   IRONPCTSAT 16 04/30/2023   IRONPCTSAT 4.7 (L) 12/17/2022     STUDIES: No results found.  ASSESSMENT AND PLAN:   Carla Gentry is a 43 y.o. female with pmh of migraine, anemia, depression was referred to hematology for management of iron  deficiency anemia.  # Iron  deficiency anemia # History of gastric bypass surgery in 2010 -Present at least since 2020. 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  - Colonoscopy from 04/16/2023 with Dr. Antony Baumgartner.  Prep was suboptimal.  Upper endoscopy was negative.  Patient not interested in rescheduling colonoscopy due to cost issues.  - Completed IV Venofer  in November 2024.  Hemoglobin today is normal at 13.9.  Iron  panel is pending.  Hold IV iron  infusion today.  -Vitamin B12 level monitoring with history of gastric bypass surgery.  Level pending today.  # Lightheadedness - Hemoglobin is normal  Patient advised to further discuss with PCP to rule out other etiologies.  Orders Placed This Encounter  Procedures   Ferritin   Iron  and TIBC([facility])   CBC with Differential (Cancer Center Only)   RTC in 6 months for MD visit, labs, possible Venofer   Patient expressed understanding and was in agreement with this plan. She also understands that She can call clinic at any time with any questions, concerns, or  complaints.   I spent a total of 15 minutes reviewing chart data, face-to-face evaluation with the patient, counseling and coordination of care as detailed above.  Loreatha Rodney, MD   10/28/2023 4:27 PM

## 2023-10-29 LAB — VITAMIN B12: Vitamin B-12: 1727 pg/mL — ABNORMAL HIGH (ref 180–914)

## 2023-11-11 ENCOUNTER — Encounter: Payer: Self-pay | Admitting: Physician Assistant

## 2023-11-11 ENCOUNTER — Ambulatory Visit: Admitting: Physician Assistant

## 2023-11-11 DIAGNOSIS — F319 Bipolar disorder, unspecified: Secondary | ICD-10-CM

## 2023-11-11 DIAGNOSIS — G4700 Insomnia, unspecified: Secondary | ICD-10-CM

## 2023-11-11 MED ORDER — MIRTAZAPINE 15 MG PO TABS
7.5000 mg | ORAL_TABLET | Freq: Every evening | ORAL | 1 refills | Status: AC | PRN
Start: 2023-11-11 — End: ?

## 2023-11-11 MED ORDER — ZALEPLON 10 MG PO CAPS
10.0000 mg | ORAL_CAPSULE | Freq: Every evening | ORAL | 5 refills | Status: DC | PRN
Start: 2023-11-11 — End: 2024-04-26

## 2023-11-11 MED ORDER — LURASIDONE HCL 40 MG PO TABS: 40.0000 mg | ORAL_TABLET | Freq: Every day | ORAL | 5 refills | Status: DC

## 2023-11-11 NOTE — Progress Notes (Signed)
 [facility]  Patient ID: Carla Gentry,  MRN: [id number]  PCP: Bluford Burkitt, NP  Date of Evaluation: 11/11/2023 Time spent:20 minutes  Chief Complaint:  Chief Complaint   Follow-up    HISTORY/CURRENT STATUS: For routine 6 month med check.  Doing well with current meds, except occas the Sonata  doesn't work. Will take old Mirtazapine  that she has left over from several years ago and it helps.   Patient is able to enjoy things.  Energy and motivation are good.  Work is going well.   No extreme sadness, tearfulness, or feelings of hopelessness.  ADLs and personal hygiene are normal.   Denies any changes in concentration, making decisions, or remembering things.  Appetite has not changed.  Weight is stable.  No c/o anxiety.  Denies suicidal or homicidal thoughts.  Patient denies increased energy with decreased need for sleep, increased talkativeness, racing thoughts, impulsivity or risky behaviors, increased spending, increased libido, grandiosity, increased irritability or anger, paranoia, or hallucinations.  Denies dizziness, syncope, seizures, numbness, tingling, tremor, tics, unsteady gait, slurred speech, confusion. Denies muscle or joint pain, stiffness, or dystonia. Denies unexplained weight loss, frequent infections, or sores that heal slowly.  No polyphagia, polydipsia, or polyuria. Denies visual changes or paresthesias.   Individual Medical History/ Review of Systems: Changes? :No   Past medications for mental health diagnoses include: Effexor  XR, Xanax, lithium , mirtazapine  caused gain weight, trazodone , Depakote, Latuda , BuSpar, Ambien  caused amnesia  Allergies: Patient has no known allergies.  Current Medications:  Current Outpatient Medications:    levonorgestrel  (MIRENA ) 20 MCG/DAY IUD, by Intrauterine route., Disp: , Rfl:    mirtazapine  (REMERON ) 15 MG tablet, Take 0.5-1 tablets (7.5-15 mg total) by mouth at bedtime as needed., Disp: 30 tablet, Rfl: 1    VITAMIN D  PO, Take by mouth., Disp: , Rfl:    lurasidone  (LATUDA ) 40 MG TABS tablet, Take 1 tablet (40 mg total) by mouth daily with supper., Disp: 30 tablet, Rfl: 5   zaleplon  (SONATA ) 10 MG capsule, Take 1 capsule (10 mg total) by mouth at bedtime as needed for sleep., Disp: 30 capsule, Rfl: 5 Medication Side Effects: none  Family Medical/ Social History: Changes? none  MENTAL HEALTH EXAM:  There were no vitals taken for this visit.There is no height or weight on file to calculate BMI.  General Appearance: Casual, Neat and Well Groomed  Eye Contact:  Good  Speech:  Clear and Coherent  Volume:  Normal  Mood:  Euthymic  Affect:  Congruent  Thought Process:  Goal Directed and Descriptions of Associations: Circumstantial  Orientation:  Full (Time, Place, and Person)  Thought Content: Logical   Suicidal Thoughts:  No  Homicidal Thoughts:  No  Memory:  WNL  Judgement:  Good  Insight:  Good  Psychomotor Activity:  Normal  Concentration:  Concentration: Good and Attention Span: Good  Recall:  Good  Fund of Knowledge: Good  Language: Good  Assets:  Communication Skills Desire for Improvement Financial Resources/Insurance Housing Resilience Transportation Vocational/Educational  ADL's:  Intact  Cognition: WNL  Prognosis:  Good   PCP follows labs  DIAGNOSES:    ICD-10-CM   1. Bipolar I disorder (HCC)  F31.9     2. Insomnia, unspecified type  G47.00       Receiving Psychotherapy: No   RECOMMENDATIONS:  PDMP was reviewed.  Sonata  filled 10/21/2023. I provided 20 minutes of face to face time during this encounter, including time spent before and after the visit in records review, medical  decision making, counseling pertinent to today's visit, and charting.   Discussed the insomnia.  On the few occasions that Sonata  is ineffective I recommend she continue the mirtazapine .  I will send in a more current prescription.  The 1 she is using is several years old.  Continue  Latuda  40 mg, 1 p.o. q. evening with at least 350 cal. Continue mirtazapine  15 mg, 1/2-1 nightly as needed sleep. Continue Sonata  10 mg, 1 p.o. nightly as needed sleep.  May repeat 1 for mid nocturnal awakening as long as she has 3 hours left to sleep, as needed. Continue vitamin D . Return in 6 months.  Marvia Slocumb, PA-C

## 2023-12-14 ENCOUNTER — Other Ambulatory Visit: Payer: Self-pay | Admitting: Physician Assistant

## 2023-12-17 ENCOUNTER — Encounter: Payer: 59 | Admitting: Nurse Practitioner

## 2024-01-06 ENCOUNTER — Encounter: Payer: Self-pay | Admitting: Nurse Practitioner

## 2024-01-06 ENCOUNTER — Ambulatory Visit: Admitting: Nurse Practitioner

## 2024-01-06 VITALS — BP 108/64 | HR 81 | Temp 98.6°F | Ht 61.0 in | Wt 122.0 lb

## 2024-01-06 DIAGNOSIS — Z1329 Encounter for screening for other suspected endocrine disorder: Secondary | ICD-10-CM

## 2024-01-06 DIAGNOSIS — Z13220 Encounter for screening for lipoid disorders: Secondary | ICD-10-CM | POA: Diagnosis not present

## 2024-01-06 DIAGNOSIS — Z0000 Encounter for general adult medical examination without abnormal findings: Secondary | ICD-10-CM | POA: Diagnosis not present

## 2024-01-06 DIAGNOSIS — E559 Vitamin D deficiency, unspecified: Secondary | ICD-10-CM | POA: Diagnosis not present

## 2024-01-06 DIAGNOSIS — E538 Deficiency of other specified B group vitamins: Secondary | ICD-10-CM

## 2024-01-06 DIAGNOSIS — Z1231 Encounter for screening mammogram for malignant neoplasm of breast: Secondary | ICD-10-CM

## 2024-01-06 DIAGNOSIS — D509 Iron deficiency anemia, unspecified: Secondary | ICD-10-CM | POA: Diagnosis not present

## 2024-01-06 DIAGNOSIS — R7303 Prediabetes: Secondary | ICD-10-CM | POA: Diagnosis not present

## 2024-01-06 DIAGNOSIS — F319 Bipolar disorder, unspecified: Secondary | ICD-10-CM | POA: Diagnosis not present

## 2024-01-06 LAB — IBC + FERRITIN
Ferritin: 45.9 ng/mL (ref 10.0–291.0)
Iron: 58 ug/dL (ref 42–145)
Saturation Ratios: 19.7 % — ABNORMAL LOW (ref 20.0–50.0)
TIBC: 294 ug/dL (ref 250.0–450.0)
Transferrin: 210 mg/dL — ABNORMAL LOW (ref 212.0–360.0)

## 2024-01-06 LAB — HEMOGLOBIN A1C: Hgb A1c MFr Bld: 5.6 % (ref 4.6–6.5)

## 2024-01-06 LAB — CBC WITH DIFFERENTIAL/PLATELET
Basophils Absolute: 0 K/uL (ref 0.0–0.1)
Basophils Relative: 0.6 % (ref 0.0–3.0)
Eosinophils Absolute: 0.6 K/uL (ref 0.0–0.7)
Eosinophils Relative: 7.2 % — ABNORMAL HIGH (ref 0.0–5.0)
HCT: 39.2 % (ref 36.0–46.0)
Hemoglobin: 13.2 g/dL (ref 12.0–15.0)
Lymphocytes Relative: 23.9 % (ref 12.0–46.0)
Lymphs Abs: 1.9 K/uL (ref 0.7–4.0)
MCHC: 33.7 g/dL (ref 30.0–36.0)
MCV: 92.6 fl (ref 78.0–100.0)
Monocytes Absolute: 0.6 K/uL (ref 0.1–1.0)
Monocytes Relative: 7.9 % (ref 3.0–12.0)
Neutro Abs: 4.8 K/uL (ref 1.4–7.7)
Neutrophils Relative %: 60.4 % (ref 43.0–77.0)
Platelets: 315 K/uL (ref 150.0–400.0)
RBC: 4.23 Mil/uL (ref 3.87–5.11)
RDW: 12.9 % (ref 11.5–15.5)
WBC: 8 K/uL (ref 4.0–10.5)

## 2024-01-06 LAB — COMPREHENSIVE METABOLIC PANEL WITH GFR
ALT: 15 U/L (ref 0–35)
AST: 16 U/L (ref 0–37)
Albumin: 3.7 g/dL (ref 3.5–5.2)
Alkaline Phosphatase: 65 U/L (ref 39–117)
BUN: 14 mg/dL (ref 6–23)
CO2: 24 meq/L (ref 19–32)
Calcium: 8.8 mg/dL (ref 8.4–10.5)
Chloride: 107 meq/L (ref 96–112)
Creatinine, Ser: 0.7 mg/dL (ref 0.40–1.20)
GFR: 106.69 mL/min (ref >60.00)
Glucose, Bld: 116 mg/dL — ABNORMAL HIGH (ref 70–99)
Potassium: 3.7 meq/L (ref 3.5–5.1)
Sodium: 137 meq/L (ref 135–145)
Total Bilirubin: 0.4 mg/dL (ref 0.2–1.2)
Total Protein: 6 g/dL (ref 6.0–8.3)

## 2024-01-06 LAB — LIPID PANEL
Cholesterol: 164 mg/dL (ref 0–200)
HDL: 68.9 mg/dL (ref >39.00)
LDL Cholesterol: 72 mg/dL (ref 0–99)
NonHDL: 94.81
Total CHOL/HDL Ratio: 2
Triglycerides: 113 mg/dL (ref 0.0–149.0)
VLDL: 22.6 mg/dL (ref 0.0–40.0)

## 2024-01-06 LAB — TSH: TSH: 0.44 u[IU]/mL (ref 0.35–5.50)

## 2024-01-06 LAB — VITAMIN B12: Vitamin B-12: 346 pg/mL (ref 211–911)

## 2024-01-06 LAB — VITAMIN D 25 HYDROXY (VIT D DEFICIENCY, FRACTURES): VITD: 28.3 ng/mL — ABNORMAL LOW (ref 30.00–100.00)

## 2024-01-06 NOTE — Progress Notes (Signed)
 Leron Glance, NP-C Phone: [phone]  Carla Gentry is a 43 y.o. female who presents today for annual exam.   Discussed the use of AI scribe software for clinical note transcription with the patient, who gave verbal consent to proceed.  History of Present Illness   Carla Gentry is a 43 year old female who presents for annual exam.  She has a history of iron  deficiency anemia, previously managed with two iron  infusions in November of the previous year, which improved her iron  levels. An upper endoscopy was normal, but a colonoscopy was incomplete due to inadequate bowel preparation. She chose not to repeat the colonoscopy due to cost concerns and stable iron  levels. Her last hematology visit in May confirmed no further infusions were needed. She is not currently taking oral iron  supplements and is scheduled to follow up with hematology in November.  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 She is under psychiatric care and takes Sonata  nightly for sleep, with mirtazapine  as a backup. Her sleep is generally stable with medication. She received B12 shots in November and stopped oral B12 supplements after her levels were high in May. She takes vitamin D  supplements.  She had a Pap smear in July 2023, which was positive for HPV, and she is due for a repeat Pap smear. She has not had a mammogram recently and plans to schedule one. 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 She has had three COVID-19 vaccinations and a tetanus shot last year.  Her diet consists mainly of snacks and one main meal per day, often eating out due to her child's sports activities. She does not exercise regularly. She recently had dental work done, including extractions, after not visiting a dentist for seven years. She had Lasik surgery in 2010 and has not had any vision problems since.  No chest pain, shortness of breath, abdominal pain, urinary symptoms, headaches, dizziness, skin  changes, joint pain, swelling, or mood disturbances.      Social History   Tobacco Use  Smoking Status Never  Smokeless Tobacco Never    Current Outpatient Medications on File Prior to Visit  Medication Sig Dispense Refill   levonorgestrel  (MIRENA ) 20 MCG/DAY IUD by Intrauterine route.     lurasidone  (LATUDA ) 40 MG TABS tablet Take 1 tablet (40 mg total) by mouth daily with supper. 30 tablet 5   mirtazapine  (REMERON ) 15 MG tablet TAKE 0.5-1 TABLETS (7.5-15 MG TOTAL) BY MOUTH AT BEDTIME AS NEEDED. 90 tablet 1   VITAMIN D  PO Take by mouth.     zaleplon  (SONATA ) 10 MG capsule Take 1 capsule (10 mg total) by mouth at bedtime as needed for sleep. 30 capsule 5   No current facility-administered medications on file prior to visit.     ROS see history of present illness  Objective  Physical Exam Vitals:   01/06/24 1301  BP: 108/64  Pulse: 81  Temp: 98.6 F (37 C)  SpO2: 99%    BP Readings from Last 3 Encounters:  01/06/24 108/64  10/28/23 110/70  05/17/23 (!) 126/90   Wt Readings from Last 3 Encounters:  01/06/24 122 lb (55.3 kg)  10/28/23 119 lb (54 kg)  04/30/23 122 lb (55.3 kg)    Physical Exam Constitutional:      General: She is not in acute distress.    Appearance: Normal appearance.  HENT:     Head: Normocephalic.     Right Ear: Tympanic membrane normal.  Left Ear: Tympanic membrane normal.     Nose: Nose normal.     Mouth/Throat:     Mouth: Mucous membranes are moist.     Pharynx: Oropharynx is clear.  Eyes:     Conjunctiva/sclera: Conjunctivae normal.     Pupils: Pupils are equal, round, and reactive to light.  Neck:     Thyroid: No thyromegaly.  Cardiovascular:     Rate and Rhythm: Normal rate and regular rhythm.     Heart sounds: Normal heart sounds.  Pulmonary:     Effort: Pulmonary effort is normal.     Breath sounds: Normal breath sounds.  Abdominal:     General: Abdomen is flat. Bowel sounds are normal.     Palpations: Abdomen is soft.  There is no mass.     Tenderness: There is no abdominal tenderness.  Musculoskeletal:        General: Normal range of motion.  Lymphadenopathy:     Cervical: No cervical adenopathy.  Skin:    General: Skin is warm and dry.     Findings: No rash.  Neurological:     General: No focal deficit present.     Mental Status: She is alert.  Psychiatric:        Mood and Affect: Mood normal.        Behavior: Behavior normal.      Assessment/Plan: Please see individual problem list.  Preventative health care Assessment & Plan: Physical exam complete. We will check routine lab work as outlined. She is due for a Pap smear and mammogram. Vaccinations are up to date. Declines additional COVID vaccines. Her diet is suboptimal, and she lacks regular exercise. Order a mammogram and schedule a Pap smear with Ob-Gyn. Continue routine dental exams. Encourage a balanced diet and regular exercise. Return to care in one year, sooner as needed.   Orders: -     Comprehensive metabolic panel with GFR  Iron  deficiency anemia, unspecified iron  deficiency anemia type Assessment & Plan: Her iron  levels have improved, and no further infusions have been needed since November. Hematology will continue monitoring. Follow-up with hematology is scheduled for November. Monitor for symptoms such as worsening fatigue or dyspnea. Check CBC and iron  today.  Orders: -     CBC with Differential/Platelet -     IBC + Ferritin  Bipolar 1 disorder (HCC) Assessment & Plan: Mood is stable on current medication regimen. Continue. Follow up with Psychiatry.    Prediabetes Assessment & Plan: Check A1c. Encourage healthy diet and regular exercise.   Orders: -     Hemoglobin A1c  Vitamin B12 deficiency Assessment & Plan: Previously elevated B12 levels led to the discontinuation of oral supplementation. Current levels will determine the need for restarting supplementation. Check B12 levels today. If B12 is low, restart  oral B12 supplementation.  Orders: -     Vitamin B12  Vitamin D  deficiency -     VITAMIN D  25 Hydroxy (Vit-D Deficiency, Fractures)  Lipid screening -     Lipid panel  Thyroid disorder screen -     TSH  Screening mammogram for breast cancer -     3D Screening Mammogram, Left and Right; Future     Return in about 1 year (around 01/05/2025) for Annual Exam, sooner as needed.   Leron Glance, NP-C [facility]

## 2024-01-06 NOTE — Patient Instructions (Signed)
 YOUR MAMMOGRAM IS DUE, PLEASE CALL AND GET THIS SCHEDULED! [facility] - call [phone]

## 2024-01-07 ENCOUNTER — Ambulatory Visit: Payer: Self-pay | Admitting: Nurse Practitioner

## 2024-01-14 ENCOUNTER — Encounter: Payer: Self-pay | Admitting: Nurse Practitioner

## 2024-01-14 NOTE — Assessment & Plan Note (Signed)
 Mood is stable on current medication regimen. Continue. Follow up with Psychiatry.

## 2024-01-14 NOTE — Assessment & Plan Note (Signed)
 Check A1c. Encourage healthy diet and regular exercise.

## 2024-01-14 NOTE — Assessment & Plan Note (Signed)
 Her iron  levels have improved, and no further infusions have been needed since November. Hematology will continue monitoring. Follow-up with hematology is scheduled for November. Monitor for symptoms such as worsening fatigue or dyspnea. Check CBC and iron  today.

## 2024-01-14 NOTE — Assessment & Plan Note (Signed)
 Previously elevated B12 levels led to the discontinuation of oral supplementation. Current levels will determine the need for restarting supplementation. Check B12 levels today. If B12 is low, restart oral B12 supplementation.

## 2024-01-14 NOTE — Assessment & Plan Note (Addendum)
 Physical exam complete. We will check routine lab work as outlined. She is due for a Pap smear and mammogram. Vaccinations are up to date. Declines additional COVID vaccines. Her diet is suboptimal, and she lacks regular exercise. Order a mammogram and schedule a Pap smear with Ob-Gyn. Continue routine dental exams. Encourage a balanced diet and regular exercise. Return to care in one year, sooner as needed.

## 2024-04-25 ENCOUNTER — Encounter: Payer: Self-pay | Admitting: Internal Medicine

## 2024-04-25 ENCOUNTER — Ambulatory Visit (HOSPITAL_COMMUNITY): Admission: EM | Admit: 2024-04-25 | Discharge: 2024-04-26 | Disposition: A | Discharge status: Home / Self Care

## 2024-04-25 DIAGNOSIS — Z79899 Other long term (current) drug therapy: Secondary | ICD-10-CM

## 2024-04-25 DIAGNOSIS — F4323 Adjustment disorder with mixed anxiety and depressed mood: Secondary | ICD-10-CM | POA: Diagnosis not present

## 2024-04-25 NOTE — Progress Notes (Signed)
   04/25/24 2047  [facility] Triage Screening (Walk-ins at [facility] only)  How Did You Hear About Us ? Self  What Is the Reason for Your Visit/Call Today? Pt presents to [facility] as a voluntary walk-in, unaccompanied with complaint of stress and diagnosis of Bipolar I Disorder. Pt reports that she got so angry at her children this evening for not completing chores and felt like her family was making fun of her. Pt stated  I just feel like I'm spiraling out of control with anger and crying. Pt reports that she is prescribed Latuda  and is compliant with medication regimen. Pt is seeing psychiatrist at [facility] Thomasine Cooks, [place]) for medication management. Pt denies being established with outpatient therapy at this time. Pt is able to contract for safety. Pt currently denies SI,HI,AVH and substance/alcohol use.  How Long Has This Been Causing You Problems? <Week  Have You Recently Had Any Thoughts About Hurting Yourself? No  Are You Planning to Commit Suicide/Harm Yourself At This time? No  Have you Recently Had Thoughts About Hurting Someone Sherral? No  Are You Planning To Harm Someone At This Time? No  Physical Abuse Denies  Verbal Abuse Denies  Sexual Abuse Denies  Exploitation of patient/patient's resources Denies  Self-Neglect Denies  Possible abuse reported to:  (NA)  Are you currently experiencing any auditory, visual or other hallucinations? No  Have You Used Any Alcohol or Drugs in the Past 24 Hours? No  Do you have any current medical co-morbidities that require immediate attention? No  Clinician description of patient physical appearance/behavior: Tearful, but cooperative, pleasant  What Do You Feel Would Help You the Most Today? Treatment for Depression or other mood problem;Medication(s)  If access to [facility] was not available, would you have sought care in the Emergency Department? No  Determination of Need Routine (7 days)  Options For Referral Other: Comment;Outpatient  Therapy;Medication Management

## 2024-04-26 ENCOUNTER — Ambulatory Visit: Admitting: Physician Assistant

## 2024-04-26 ENCOUNTER — Encounter: Payer: Self-pay | Admitting: Physician Assistant

## 2024-04-26 DIAGNOSIS — F3160 Bipolar disorder, current episode mixed, unspecified: Secondary | ICD-10-CM | POA: Diagnosis not present

## 2024-04-26 DIAGNOSIS — R454 Irritability and anger: Secondary | ICD-10-CM | POA: Diagnosis not present

## 2024-04-26 DIAGNOSIS — G4700 Insomnia, unspecified: Secondary | ICD-10-CM

## 2024-04-26 MED ORDER — ZALEPLON 10 MG PO CAPS: 10.0000 mg | ORAL_CAPSULE | Freq: Every evening | ORAL | 5 refills | Status: AC | PRN

## 2024-04-26 MED ORDER — LURASIDONE HCL 60 MG PO TABS: 60.0000 mg | ORAL_TABLET | Freq: Every day | ORAL | 1 refills | Status: DC

## 2024-04-26 NOTE — Discharge Instructions (Signed)
 You are encouraged to follow up with [facility] for outpatient treatment.  Walk in/ Open Access Hours: Monday - Friday 8AM - 11AM (To see provider and therapist) Friday - 1PM - 4PM (To see therapist only)  [facility] [address] [place], [place] [phone]
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
## 2024-05-01 ENCOUNTER — Other Ambulatory Visit: Payer: Self-pay | Admitting: *Deleted

## 2024-05-01 DIAGNOSIS — D509 Iron deficiency anemia, unspecified: Secondary | ICD-10-CM

## 2024-05-02 ENCOUNTER — Inpatient Hospital Stay (HOSPITAL_BASED_OUTPATIENT_CLINIC_OR_DEPARTMENT_OTHER): Payer: Self-pay | Admitting: Oncology

## 2024-05-02 ENCOUNTER — Inpatient Hospital Stay: Payer: Self-pay

## 2024-05-02 ENCOUNTER — Encounter: Payer: Self-pay | Admitting: Oncology

## 2024-05-02 ENCOUNTER — Inpatient Hospital Stay: Payer: Self-pay | Attending: Oncology

## 2024-05-02 VITALS — BP 117/80 | HR 76 | Temp 97.1°F | Resp 18 | Ht 61.0 in | Wt 121.0 lb

## 2024-05-02 DIAGNOSIS — D509 Iron deficiency anemia, unspecified: Secondary | ICD-10-CM | POA: Insufficient documentation

## 2024-05-02 DIAGNOSIS — Z9884 Bariatric surgery status: Secondary | ICD-10-CM | POA: Diagnosis not present

## 2024-05-02 LAB — CBC WITH DIFFERENTIAL/PLATELET
Abs Immature Granulocytes: 0.02 K/uL (ref 0.00–0.07)
Basophils Absolute: 0.1 K/uL (ref 0.0–0.1)
Basophils Relative: 1 %
Eosinophils Absolute: 0.3 K/uL (ref 0.0–0.5)
Eosinophils Relative: 4 %
HCT: 39.9 % (ref 36.0–46.0)
Hemoglobin: 13.5 g/dL (ref 12.0–15.0)
Immature Granulocytes: 0 %
Lymphocytes Relative: 29 %
Lymphs Abs: 2.3 K/uL (ref 0.7–4.0)
MCH: 30.3 pg (ref 26.0–34.0)
MCHC: 33.8 g/dL (ref 30.0–36.0)
MCV: 89.7 fL (ref 80.0–100.0)
Monocytes Absolute: 0.8 K/uL (ref 0.1–1.0)
Monocytes Relative: 10 %
Neutro Abs: 4.6 K/uL (ref 1.7–7.7)
Neutrophils Relative %: 56 %
Platelets: 312 K/uL (ref 150–400)
RBC: 4.45 MIL/uL (ref 3.87–5.11)
RDW: 12.1 % (ref 11.5–15.5)
WBC: 8 K/uL (ref 4.0–10.5)
nRBC: 0 % (ref 0.0–0.2)

## 2024-05-02 LAB — IRON AND TIBC
Iron: 77 ug/dL (ref 28–170)
Saturation Ratios: 22 % (ref 10.4–31.8)
TIBC: 358 ug/dL (ref 250–450)
UIBC: 281 ug/dL

## 2024-05-02 LAB — FERRITIN: Ferritin: 45 ng/mL (ref 11–307)

## 2024-05-02 NOTE — Progress Notes (Unsigned)
 [facility]  [phone] [phone]) [phone]  ID: Carla Gentry OB: Sep 18, 1980  MR#: [id number]  RDW#:[id number]  Patient Care Team: Gretel App, NP as PCP - General (Nurse Practitioner) Jacobo Evalene PARAS, MD as Consulting Physician (Oncology)  CHIEF COMPLAINT: Iron  deficiency anemia.  INTERVAL HISTORY: Patient returns to clinic today for repeat laboratory work, further evaluation, and consideration of additional IV Venofer .  She currently feels well and is asymptomatic.  She does not complain of any weakness or fatigue.  She has no neurologic complaints.  She denies any recent fevers or illnesses.  She has a good appetite and denies weight loss.  She has no chest pain, shortness of breath, cough, or hemoptysis.  She denies any nausea, vomiting, constipation, or diarrhea.  She has no melena or hematochezia.  She has no urinary complaints.  Patient offers no specific complaints today.  REVIEW OF SYSTEMS:   Review of Systems  Constitutional: Negative.  Negative for fever, malaise/fatigue and weight loss.  Respiratory: Negative.  Negative for cough, hemoptysis and shortness of breath.   Cardiovascular: Negative.  Negative for chest pain and leg swelling.  Gastrointestinal: Negative.  Negative for abdominal pain, blood in stool and melena.  Genitourinary: Negative.  Negative for dysuria.  Musculoskeletal: Negative.  Negative for back pain.  Skin: Negative.  Negative for rash.  Neurological: Negative.  Negative for dizziness, focal weakness, weakness and headaches.  Psychiatric/Behavioral: Negative.  The patient is not nervous/anxious.     As per HPI. Otherwise, a complete review of systems is negative.  PAST MEDICAL HISTORY: Past Medical History:  Diagnosis Date   Anemia    Anxiety    Bipolar disorder (HCC)    Depression    Migraine    Pre-diabetes     PAST SURGICAL HISTORY: Past Surgical History:  Procedure Laterality Date   BIOPSY   04/16/2023   Procedure: BIOPSY;  Surgeon: Therisa Bi, MD;  Location: [facility] ENDOSCOPY;  Service: Gastroenterology;;   CESAREAN SECTION     CHOLECYSTECTOMY     COLONOSCOPY WITH PROPOFOL  N/A 04/16/2023   Procedure: COLONOSCOPY WITH PROPOFOL ;  Surgeon: Therisa Bi, MD;  Location: [facility] ENDOSCOPY;  Service: Gastroenterology;  Laterality: N/A;   ESOPHAGOGASTRODUODENOSCOPY (EGD) WITH PROPOFOL  N/A 04/16/2023   Procedure: ESOPHAGOGASTRODUODENOSCOPY (EGD) WITH PROPOFOL ;  Surgeon: Therisa Bi, MD;  Location: [facility] ENDOSCOPY;  Service: Gastroenterology;  Laterality: N/A;   GASTRIC BYPASS     HYSTEROSCOPY N/A 03/30/2022   Procedure: HYSTEROSCOPY;  Surgeon: Janit Alm Agent, MD;  Location: [facility] ORS;  Service: Gynecology;  Laterality: N/A;   INTRAUTERINE DEVICE (IUD) INSERTION N/A 03/30/2022   Procedure: INTRAUTERINE DEVICE (IUD) INSERTION;  Surgeon: Janit Alm Agent, MD;  Location: [facility] ORS;  Service: Gynecology;  Laterality: N/A;   IUD REMOVAL N/A 03/30/2022   Procedure: INTRAUTERINE DEVICE (IUD) REMOVAL;  Surgeon: Janit Alm Agent, MD;  Location: [facility] ORS;  Service: Gynecology;  Laterality: N/A;   OCCIPITAL NEURECTOMY     WISDOM TOOTH EXTRACTION      FAMILY HISTORY: Family History  Problem Relation Age of Onset   Hypertension Father    Depression Maternal Grandfather    Cancer Paternal Grandmother     ADVANCED DIRECTIVES (Y/N):  N  HEALTH MAINTENANCE: Social History   Tobacco Use   Smoking status: Never   Smokeless tobacco: Never  Vaping Use   Vaping status: Never Used  Substance Use Topics   Alcohol use: Not Currently    Comment: rarely   Drug use: Never  Colonoscopy:  PAP:  Bone density:  Lipid panel:  No Known Allergies  Current Outpatient Medications  Medication Sig Dispense Refill   levonorgestrel  (MIRENA ) 20 MCG/DAY IUD by Intrauterine route.     Lurasidone  HCl 60 MG TABS Take 1 tablet (60 mg total) by mouth daily with supper. 30 tablet 1   mirtazapine  (REMERON ) 15  MG tablet TAKE 0.5-1 TABLETS (7.5-15 MG TOTAL) BY MOUTH AT BEDTIME AS NEEDED. 90 tablet 1   VITAMIN D  PO Take by mouth.     zaleplon  (SONATA ) 10 MG capsule Take 1 capsule (10 mg total) by mouth at bedtime as needed for sleep. 30 capsule 5   No current facility-administered medications for this visit.    OBJECTIVE: Vitals:   05/02/24 1523  BP: 117/80  Pulse: 76  Resp: 18  Temp: (!) 97.1 F (36.2 C)  SpO2: 98%     Body mass index is 22.86 kg/m.    ECOG FS:0 - Asymptomatic  General: Well-developed, well-nourished, no acute distress. Eyes: Pink conjunctiva, anicteric sclera. HEENT: Normocephalic, moist mucous membranes. Lungs: No audible wheezing or coughing. Heart: Regular rate and rhythm. Abdomen: Soft, nontender, no obvious distention. Musculoskeletal: No edema, cyanosis, or clubbing. Neuro: Alert, answering all questions appropriately. Cranial nerves grossly intact. Skin: No rashes or petechiae noted. Psych: Normal affect.  LAB RESULTS:  Lab Results  Component Value Date   NA 137 01/06/2024   K 3.7 01/06/2024   CL 107 01/06/2024   CO2 24 01/06/2024   GLUCOSE 116 (H) 01/06/2024   BUN 14 01/06/2024   CREATININE 0.70 01/06/2024   CALCIUM 8.8 01/06/2024   PROT 6.0 01/06/2024   ALBUMIN 3.7 01/06/2024   AST 16 01/06/2024   ALT 15 01/06/2024   ALKPHOS 65 01/06/2024   BILITOT 0.4 01/06/2024   GFRNONAA 102 03/24/2019   GFRAA 118 03/24/2019    Lab Results  Component Value Date   WBC 8.0 05/02/2024   NEUTROABS 4.6 05/02/2024   HGB 13.5 05/02/2024   HCT 39.9 05/02/2024   MCV 89.7 05/02/2024   PLT 312 05/02/2024   Lab Results  Component Value Date   IRON  77 05/02/2024   TIBC 358 05/02/2024   IRONPCTSAT 22 05/02/2024   Lab Results  Component Value Date   FERRITIN 45 05/02/2024     STUDIES: No results found.  ASSESSMENT: Iron  deficiency anemia.  PLAN:    Iron  deficiency anemia: Resolved.  Patient has a history of gastric bypass surgery in 2010.   Hemoglobin and iron  stores continue to be within normal limits.  No intervention is needed at this time.  Patient last received IV Venofer  on May 17, 2023.  Return to clinic in 6 months with repeat laboratory work and evaluation by APP.  If patient's laboratory work continues to be within normal limits, she likely can be discharged from clinic.  I spent a total of 20 minutes reviewing chart data, face-to-face evaluation with the patient, counseling and coordination of care as detailed above.   Patient expressed understanding and was in agreement with this plan. She also understands that She can call clinic at any time with any questions, concerns, or complaints.     Evalene JINNY Reusing, MD   05/04/2024 6:37 AM

## 2024-05-04 ENCOUNTER — Encounter: Payer: Self-pay | Admitting: Internal Medicine

## 2024-05-16 ENCOUNTER — Ambulatory Visit: Admitting: Physician Assistant

## 2024-05-19 ENCOUNTER — Other Ambulatory Visit: Payer: Self-pay | Admitting: Physician Assistant

## 2024-05-19 NOTE — Telephone Encounter (Signed)
 Would refuse this, has refill at pharmacy

## 2024-06-05 ENCOUNTER — Encounter: Payer: Self-pay | Admitting: Internal Medicine

## 2024-07-06 ENCOUNTER — Ambulatory Visit: Admitting: Physician Assistant

## 2024-07-06 ENCOUNTER — Encounter: Payer: Self-pay | Admitting: Physician Assistant

## 2024-07-06 DIAGNOSIS — F319 Bipolar disorder, unspecified: Secondary | ICD-10-CM | POA: Diagnosis not present

## 2024-07-06 DIAGNOSIS — G4700 Insomnia, unspecified: Secondary | ICD-10-CM

## 2024-07-06 MED ORDER — LURASIDONE HCL 60 MG PO TABS: 60.0000 mg | ORAL_TABLET | Freq: Every day | ORAL | 5 refills | Status: AC

## 2024-07-06 NOTE — Progress Notes (Signed)
"       [facility]  Patient ID: Carla Gentry,  MRN: [id number]  PCP: Gretel App, NP  Date of Evaluation: 07/06/2024 Time spent:20 minutes  Chief Complaint:  Chief Complaint   Anxiety; Insomnia; Follow-up    HISTORY/CURRENT STATUS: For routine med check.  Carla Gentry is doing well.  Meds are effective. Latuda  was increased in October and has been helpful.  No sx of depression.  No anhedonia.  Work is fine.  Sleeps good most of the time.  Situational anx is controlled. No manic sx.  No AH/VH. No SI/HI.  Individual Medical History/ Review of Systems: Changes? :No   Past medications for mental health diagnoses include: Effexor  XR, Xanax, lithium , mirtazapine  caused gain weight, trazodone , Depakote, Latuda , BuSpar, Ambien  caused amnesia, Lamictal   Allergies: Patient has no known allergies.  Current Medications:  Current Outpatient Medications:    levonorgestrel  (MIRENA ) 20 MCG/DAY IUD, by Intrauterine route., Disp: , Rfl:    mirtazapine  (REMERON ) 15 MG tablet, TAKE 0.5-1 TABLETS (7.5-15 MG TOTAL) BY MOUTH AT BEDTIME AS NEEDED., Disp: 90 tablet, Rfl: 1   VITAMIN D  PO, Take by mouth., Disp: , Rfl:    zaleplon  (SONATA ) 10 MG capsule, Take 1 capsule (10 mg total) by mouth at bedtime as needed for sleep., Disp: 30 capsule, Rfl: 5   Lurasidone  HCl 60 MG TABS, Take 1 tablet (60 mg total) by mouth daily with supper., Disp: 30 tablet, Rfl: 5 Medication Side Effects: none  Family Medical/ Social History: Changes? none  MENTAL HEALTH EXAM:  There were no vitals taken for this visit.There is no height or weight on file to calculate BMI.  General Appearance: Casual, Neat and Well Groomed  Eye Contact:  Good  Speech:  Clear and Coherent  Volume:  Normal  Mood:  Euthymic  Affect:  Congruent  Thought Process:  Goal Directed and Descriptions of Associations: Circumstantial  Orientation:  Full (Time, Place, and Person)  Thought Content: Logical   Suicidal Thoughts:  No  Homicidal  Thoughts:  No  Memory:  WNL  Judgement:  Good  Insight:  Good  Psychomotor Activity:  Normal  Concentration:  Concentration: Good and Attention Span: Good  Recall:  Good  Fund of Knowledge: Good  Language: Good  Assets:  Communication Skills Desire for Improvement Financial Resources/Insurance Housing Resilience Social Support Transportation Vocational/Educational  ADL's:  Intact  Cognition: WNL  Prognosis:  Good   PCP follows labs  DIAGNOSES:    ICD-10-CM   1. Bipolar I disorder (HCC)  F31.9     2. Insomnia, unspecified type  G47.00      Receiving Psychotherapy: No   RECOMMENDATIONS:  PDMP was reviewed.  Sonata  filled 06/17/2024. I provided approximately  20  minutes of face to face time during this encounter, including time spent before and after the visit in records review, medical decision making, counseling pertinent to today's visit, and charting.   She's doing well so no changes need to be made.    Continue  Latuda  60 mg with food. Continue mirtazapine  15 mg, 1/2-1 nightly as needed sleep. Continue Sonata  10 mg, 1 p.o. nightly as needed sleep.  May repeat 1 for mid nocturnal awakening as long as she has 3 hours left to sleep, as needed. Continue vitamin D . Return in 6 months.    Verneita Cooks, PA-C  "

## 2024-11-07 ENCOUNTER — Inpatient Hospital Stay: Payer: Self-pay

## 2024-11-08 ENCOUNTER — Inpatient Hospital Stay: Payer: Self-pay

## 2024-11-08 ENCOUNTER — Inpatient Hospital Stay: Payer: Self-pay | Admitting: Nurse Practitioner

## 2025-01-04 ENCOUNTER — Ambulatory Visit (INDEPENDENT_AMBULATORY_CARE_PROVIDER_SITE_OTHER): Admitting: Physician Assistant

## 2025-01-09 ENCOUNTER — Encounter: Admitting: Nurse Practitioner
